# Patient Record
Sex: Female | Born: 1995 | Race: White | Hispanic: No | Marital: Married | Smoking: Former smoker
Health system: Southern US, Community
[De-identification: ages and names within clinical notes are randomized; demographics above are authoritative.]

## PROBLEM LIST (undated history)

## (undated) DIAGNOSIS — K529 Noninfective gastroenteritis and colitis, unspecified: Secondary | ICD-10-CM

## (undated) DIAGNOSIS — N3010 Interstitial cystitis (chronic) without hematuria: Secondary | ICD-10-CM

## (undated) DIAGNOSIS — F419 Anxiety disorder, unspecified: Secondary | ICD-10-CM

## (undated) DIAGNOSIS — G44099 Other trigeminal autonomic cephalgias (TAC), not intractable: Secondary | ICD-10-CM

## (undated) DIAGNOSIS — J45909 Unspecified asthma, uncomplicated: Secondary | ICD-10-CM

## (undated) DIAGNOSIS — F902 Attention-deficit hyperactivity disorder, combined type: Secondary | ICD-10-CM

## (undated) HISTORY — DX: Other trigeminal autonomic cephalgias (tac), not intractable: G44.099

## (undated) HISTORY — DX: Unspecified asthma, uncomplicated: J45.909

## (undated) HISTORY — DX: Interstitial cystitis (chronic) without hematuria: N30.10

## (undated) HISTORY — DX: Attention-deficit hyperactivity disorder, combined type: F90.2

## (undated) HISTORY — DX: Anxiety disorder, unspecified: F41.9

## (undated) HISTORY — DX: Noninfective gastroenteritis and colitis, unspecified: K52.9

---
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
## 2018-09-29 DIAGNOSIS — F331 Major depressive disorder, recurrent, moderate: Secondary | ICD-10-CM | POA: Insufficient documentation

## 2018-09-29 HISTORY — DX: Major depressive disorder, recurrent, moderate: F33.1

## 2019-07-20 DIAGNOSIS — IMO0002 Reserved for concepts with insufficient information to code with codable children: Secondary | ICD-10-CM | POA: Insufficient documentation

## 2019-07-20 DIAGNOSIS — G43009 Migraine without aura, not intractable, without status migrainosus: Secondary | ICD-10-CM

## 2019-07-20 HISTORY — DX: Reserved for concepts with insufficient information to code with codable children: IMO0002

## 2019-07-20 HISTORY — DX: Migraine without aura, not intractable, without status migrainosus: G43.009

## 2019-08-16 ENCOUNTER — Encounter: Payer: Self-pay | Admitting: *Deleted

## 2019-08-16 ENCOUNTER — Ambulatory Visit (INDEPENDENT_AMBULATORY_CARE_PROVIDER_SITE_OTHER): Payer: BC Managed Care – PPO

## 2019-08-16 ENCOUNTER — Encounter: Payer: Self-pay | Admitting: Cardiology

## 2019-08-16 ENCOUNTER — Ambulatory Visit (INDEPENDENT_AMBULATORY_CARE_PROVIDER_SITE_OTHER): Payer: BC Managed Care – PPO | Admitting: Cardiology

## 2019-08-16 ENCOUNTER — Other Ambulatory Visit: Payer: Self-pay

## 2019-08-16 VITALS — BP 120/60 | HR 80 | Ht 71.0 in | Wt 161.0 lb

## 2019-08-16 DIAGNOSIS — R55 Syncope and collapse: Secondary | ICD-10-CM

## 2019-08-16 DIAGNOSIS — R0789 Other chest pain: Secondary | ICD-10-CM

## 2019-08-16 DIAGNOSIS — R002 Palpitations: Secondary | ICD-10-CM

## 2019-08-16 HISTORY — DX: Syncope and collapse: R55

## 2019-08-16 HISTORY — DX: Other chest pain: R07.89

## 2019-08-16 HISTORY — DX: Palpitations: R00.2

## 2019-08-16 NOTE — Patient Instructions (Signed)
Medication Instructions:  Your physician recommends that you continue on your current medications as directed. Please refer to the Current Medication list given to you today.  *If you need a refill on your cardiac medications before your next appointment, please call your pharmacy*   Lab Work: None If you have labs (blood work) drawn today and your tests are completely normal, you will receive your results only by: . MyChart Message (if you have MyChart) OR . A paper copy in the mail If you have any lab test that is abnormal or we need to change your treatment, we will call you to review the results.   Testing/Procedures: Your physician has requested that you have an echocardiogram. Echocardiography is a painless test that uses sound waves to create images of your heart. It provides your doctor with information about the size and shape of your heart and how well your heart's chambers and valves are working. This procedure takes approximately one hour. There are no restrictions for this procedure.  A zio monitor was ordered today. It will remain on for 14 days. You will then return monitor and event diary in provided box. It takes 1-2 weeks for report to be downloaded and returned to us. We will call you with the results. If monitor falls off or has orange flashing light, please call Zio for further instructions.      Follow-Up: At [facility], you and your health needs are our priority.  As part of our continuing mission to provide you with exceptional heart care, we have created designated Provider Care Teams.  These Care Teams include your primary Cardiologist (physician) and Advanced Practice Providers (APPs -  Physician Assistants and Nurse Practitioners) who all work together to provide you with the care you need, when you need it.  We recommend signing up for the patient portal called "MyChart".  Sign up information is provided on this After Visit Summary.  MyChart is used to  connect with patients for Virtual Visits (Telemedicine).  Patients are able to view lab/test results, encounter notes, upcoming appointments, etc.  Non-urgent messages can be sent to your provider as well.   To learn more about what you can do with MyChart, go to [email or web address].    Your next appointment:   3 month(s)  The format for your next appointment:   In Person  Provider:   Kardie Tobb, DO   Other Instructions   

## 2019-08-16 NOTE — Progress Notes (Signed)
Cardiology Office Note:    Date:  08/16/2019   ID:  Cheryl Andrews, DOB 05/11/1995, MRN [id number]  PCP:  Adrienne Mocha, PA  Cardiologist:  Thomasene Ripple, DO  Electrophysiologist:  None   Referring MD: Adrienne Mocha, PA   Chief Complaint  Patient presents with  . Tachycardia  . Loss of Consciousness    History of Present Illness:    Cheryl Andrews is a 24 y.o. female with a hx of anxiety disorder, ADHD, migraine headaches and asthma presents today to be evaluated for palpitations as well as syncope.  Patient tells me that over the last several months she has had increasing palpitations.  She notes that sometimes with her palpitation she feels dizzy and nauseous.  At first she thought that this was associated with her migraine headaches but recently noticed that is actually not all the time associated with migraine headaches.  In addition she is really concerned as she has had some syncope episodes.  She notes that prior to her passing out she experienced some blurry vision slowly goes and she passes out.  She is not sure how long she is down for when she passed out.  But she usually wakes up and denies any bowel or bladder incontinence.  I was able to speak with her partner who also says she does not have any seizure activities during this time.  The patient tells me that one of her episode she felt as if she was having diffuse shooting chest pain going across her chest.  No other complaints at this time.  Past Medical History:  Diagnosis Date  . ADHD (attention deficit hyperactivity disorder), combined type   . Anxiety   . Asthma   . Chronic migraine 07/20/2019  . Interstitial cystitis   . MDD (major depressive disorder), recurrent episode, moderate (HCC) 09/29/2018  . Migraine without aura and without status migrainosus, not intractable 07/20/2019  . Trigeminal autonomic cephalgias     Past Surgical History:  Procedure Laterality Date  . TONSILLECTOMY  2002    Current  Medications: Current Meds  Medication Sig  . albuterol (VENTOLIN HFA) 108 (90 Base) MCG/ACT inhaler Inhale 1 puff into the lungs every 6 (six) hours as needed for wheezing or shortness of breath.  . amphetamine-dextroamphetamine (ADDERALL) 5 MG tablet Take 5 mg by mouth daily.  . chlorzoxazone (PARAFON) 500 MG tablet Take 500 mg by mouth 3 (three) times daily as needed for muscle spasms.  . DULoxetine (CYMBALTA) 20 MG capsule Take 20 mg by mouth 2 (two) times daily.  Marland Kitchen levonorgestrel (KYLEENA) 19.5 MG IUD by Intrauterine route once.  . Meth-Hyo-M Bl-Na Phos-Ph Sal (URIBEL) 118 MG CAPS Take 1 capsule by mouth in the morning, at noon, in the evening, and at bedtime.   . naproxen sodium (ALEVE) 220 MG tablet Take 220 mg by mouth as needed.  . ondansetron (ZOFRAN) 4 MG tablet Take 4 mg by mouth daily.  . Rimegepant Sulfate (NURTEC) 75 MG TBDP Take 1 tablet by mouth every other day.     Allergies:   Paba derivatives and Protonix [pantoprazole]   Social History   Socioeconomic History  . Marital status: Married    Spouse name: Not on file  . Number of children: Not on file  . Years of education: Not on file  . Highest education level: Not on file  Occupational History  . Not on file  Tobacco Use  . Smoking status: Former Smoker    Types: Cigarettes  Quit date: 2016    Years since quitting: 5.6  . Smokeless tobacco: Never Used  Substance and Sexual Activity  . Alcohol use: Never  . Drug use: Never  . Sexual activity: Not on file  Other Topics Concern  . Not on file  Social History Narrative  . Not on file   Social Determinants of Health   Financial Resource Strain:   . Difficulty of Paying Living Expenses:   Food Insecurity:   . Worried About Programme researcher, broadcasting/film/video in the Last Year:   . Barista in the Last Year:   Transportation Needs:   . Freight forwarder (Medical):   Marland Kitchen Lack of Transportation (Non-Medical):   Physical Activity:   . Days of Exercise per Week:    . Minutes of Exercise per Session:   Stress:   . Feeling of Stress :   Social Connections:   . Frequency of Communication with Friends and Family:   . Frequency of Social Gatherings with Friends and Family:   . Attends Religious Services:   . Active Member of Clubs or Organizations:   . Attends Banker Meetings:   Marland Kitchen Marital Status:      Family History: The patient's family history includes Arrhythmia in her mother; Diabetes in her maternal grandfather and paternal grandfather; Hypertension in her father, maternal grandfather, mother, and paternal grandfather; Thyroid disease in her mother; Uterine cancer in her paternal grandmother.  ROS:   Review of Systems  Constitution: Reports dizziness.  Negative for decreased appetite, fever and weight gain.  HENT: Negative for congestion, ear discharge, hoarse voice and sore throat.   Eyes: Negative for discharge, redness, vision loss in right eye and visual halos.  Cardiovascular: Reports sharp chest pain and palpitations.  Negative for dyspnea on exertion, leg swelling. Respiratory: Negative for cough, hemoptysis, shortness of breath and snoring.   Endocrine: Negative for heat intolerance and polyphagia.  Hematologic/Lymphatic: Negative for bleeding problem. Does not bruise/bleed easily.  Skin: Negative for flushing, nail changes, rash and suspicious lesions.  Musculoskeletal: Negative for arthritis, joint pain, muscle cramps, myalgias, neck pain and stiffness.  Gastrointestinal: Negative for abdominal pain, bowel incontinence, diarrhea and excessive appetite.  Genitourinary: Negative for decreased libido, genital sores and incomplete emptying.  Neurological: Negative for brief paralysis, focal weakness, headaches and loss of balance.  Psychiatric/Behavioral: Negative for altered mental status, depression and suicidal ideas.  Allergic/Immunologic: Negative for HIV exposure and persistent infections.    EKGs/Labs/Other Studies  Reviewed:    The following studies were reviewed today:   EKG:  The ekg ordered today demonstrates sinus rhythm, heart rate 78 bpm with P wave morphology she has a left atrial argument.  RSR prime.  No prior EKG for comparison.  Recent Labs: No results found for requested labs within last 8760 hours.  Recent Lipid Panel No results found for: CHOL, TRIG, HDL, CHOLHDL, VLDL, LDLCALC, LDLDIRECT  Physical Exam:    VS:  BP 120/60 (BP Location: Left Arm, Patient Position: Sitting, Cuff Size: Normal)   Pulse 80   Ht 5\' 11"  (1.803 m)   Wt 161 lb (73 kg)   SpO2 99%   BMI 22.45 kg/m     Wt Readings from Last 3 Encounters:  08/16/19 161 lb (73 kg)     GEN: Well nourished, well developed in no acute distress HEENT: Normal NECK: No JVD; No carotid bruits LYMPHATICS: No lymphadenopathy CARDIAC: S1S2 noted,RRR, no murmurs, rubs, gallops RESPIRATORY:  Clear to auscultation  without rales, wheezing or rhonchi  ABDOMEN: Soft, non-tender, non-distended, +bowel sounds, no guarding. EXTREMITIES: No edema, No cyanosis, no clubbing MUSCULOSKELETAL:  No deformity  SKIN: Warm and dry NEUROLOGIC:  Alert and oriented x 3, non-focal PSYCHIATRIC:  Normal affect, good insight  ASSESSMENT:    1. Palpitations   2. Syncope and collapse   3. Atypical chest pain    PLAN:    1.  I would like to rule out a cardiovascular etiology of this palpitation and syncope, therefore at this time I would like to placed a zio patch for 14 days. In additon a transthoracic echocardiogram will be ordered to assess LV/RV function and any structural abnormalities. Once these testing have been performed amd reviewed further reccomendations will be made. For now, I do reccomend that the patient goes to the nearest ED if  symptoms recur.  2.  Her chest pain is atypical therefore we will get an echocardiogram above in reassess need for any further testing.  3.  I did discuss the [place] DMV medical guidelines for driving: "it is  prudent to recommend that all persons should be free of syncopal episodes for at least six months to be granted the driving privilege." (THE [place] PHYSICIAN'S GUIDE TO DRIVER MEDICAL EVALUATION, Second Edition, Medical Review Branch, Associate ProfessorDriver License Section, Division of [employer], [employer] [place] Department of Transportation, July 2004).  I reviewed her lab which was done at her PCP office TSH is 1.11 no need to repeat any of her lab studies.  The patient is in agreement with the above plan. The patient left the office in stable condition.  The patient will follow up in 3 months or sooner if needed.   Medication Adjustments/Labs and Tests Ordered: Current medicines are reviewed at length with the patient today.  Concerns regarding medicines are outlined above.  Orders Placed This Encounter  Procedures  . LONG TERM MONITOR (3-14 DAYS)  . EKG 12-Lead  . ECHOCARDIOGRAM COMPLETE   No orders of the defined types were placed in this encounter.   Patient Instructions  Medication Instructions:  Your physician recommends that you continue on your current medications as directed. Please refer to the Current Medication list given to you today.  *If you need a refill on your cardiac medications before your next appointment, please call your pharmacy*   Lab Work: None If you have labs (blood work) drawn today and your tests are completely normal, you will receive your results only by: Marland Kitchen. MyChart Message (if you have MyChart) OR . A paper copy in the mail If you have any lab test that is abnormal or we need to change your treatment, we will call you to review the results.   Testing/Procedures: Your physician has requested that you have an echocardiogram. Echocardiography is a painless test that uses sound waves to create images of your heart. It provides your doctor with information about the size and shape of your heart and how well your heart's chambers and valves are working. This  procedure takes approximately one hour. There are no restrictions for this procedure.  A zio monitor was ordered today. It will remain on for 14 days. You will then return monitor and event diary in provided box. It takes 1-2 weeks for report to be downloaded and returned to us. We will call you with the results. If monitor falls off or has orange flashing light, please call Zio for further instructions.      Follow-Up: [facility], you and  your health needs are our priority.  As part of our continuing mission to provide you with exceptional heart care, we have created designated Provider Care Teams.  These Care Teams include your primary Cardiologist (physician) and Advanced Practice Providers (APPs -  Physician Assistants and Nurse Practitioners) who all work together to provide you with the care you need, when you need it.  We recommend signing up for the patient portal called "MyChart".  Sign up information is provided on this After Visit Summary.  MyChart is used to connect with patients for Virtual Visits (Telemedicine).  Patients are able to view lab/test results, encounter notes, upcoming appointments, etc.  Non-urgent messages can be sent to your provider as well.   To learn more about what you can do with MyChart, go to [email or web address].    Your next appointment:   3 month(s)  The format for your next appointment:   In Person  Provider:   Thomasene Ripple, DO   Other Instructions      Adopting a Healthy Lifestyle.  Know what a healthy weight is for you (roughly BMI <25) and aim to maintain this   Aim for 7+ servings of fruits and vegetables daily   65-80+ fluid ounces of water or unsweet tea for healthy kidneys   Limit to max 1 drink of alcohol per day; avoid smoking/tobacco   Limit animal fats in diet for cholesterol and heart health - choose grass fed whenever available   Avoid highly processed foods, and foods high in saturated/trans fats   Aim for low  stress - take time to unwind and care for your mental health   Aim for 150 min of moderate intensity exercise weekly for heart health, and weights twice weekly for bone health   Aim for 7-9 hours of sleep daily   When it comes to diets, agreement about the perfect plan isnt easy to find, even among the experts. Experts at the [facility] of [employer] developed an idea known as the Healthy Eating Plate. Just imagine a plate divided into logical, healthy portions.   The emphasis is on diet quality:   Load up on vegetables and fruits - one-half of your plate: Aim for color and variety, and remember that potatoes dont count.   Go for whole grains - one-quarter of your plate: Whole wheat, barley, wheat berries, quinoa, oats, brown rice, and foods made with them. If you want pasta, go with whole wheat pasta.   Protein power - one-quarter of your plate: Fish, chicken, beans, and nuts are all healthy, versatile protein sources. Limit red meat.   The diet, however, does go beyond the plate, offering a few other suggestions.   Use healthy plant oils, such as olive, canola, soy, corn, sunflower and peanut. Check the labels, and avoid partially hydrogenated oil, which have unhealthy trans fats.   If youre thirsty, drink water. Coffee and tea are good in moderation, but skip sugary drinks and limit milk and dairy products to one or two daily servings.   The type of carbohydrate in the diet is more important than the amount. Some sources of carbohydrates, such as vegetables, fruits, whole grains, and beans-are healthier than others.   Finally, stay active  Signed, Thomasene Ripple, DO  08/16/2019 4:21 PM    [facility]

## 2019-08-18 ENCOUNTER — Other Ambulatory Visit: Payer: Self-pay

## 2019-08-18 ENCOUNTER — Ambulatory Visit (INDEPENDENT_AMBULATORY_CARE_PROVIDER_SITE_OTHER): Payer: BC Managed Care – PPO

## 2019-08-18 DIAGNOSIS — R55 Syncope and collapse: Secondary | ICD-10-CM | POA: Diagnosis not present

## 2019-08-18 DIAGNOSIS — R002 Palpitations: Secondary | ICD-10-CM | POA: Diagnosis not present

## 2019-08-18 LAB — ECHOCARDIOGRAM COMPLETE
Area-P 1/2: 3.6 cm2
S' Lateral: 2.8 cm

## 2019-08-18 NOTE — Progress Notes (Signed)
Complete echocardiogram has been performed.  Jimmy Adarius Tigges RDCS, RVT 

## 2019-08-19 ENCOUNTER — Telehealth: Payer: Self-pay

## 2019-08-19 ENCOUNTER — Telehealth: Payer: Self-pay | Admitting: Cardiology

## 2019-08-19 ENCOUNTER — Encounter: Payer: Self-pay | Admitting: Neurology

## 2019-08-19 NOTE — Telephone Encounter (Signed)
Left message on patients voicemail to please return our call.   

## 2019-08-19 NOTE — Telephone Encounter (Signed)
-----   Message from Thomasene Ripple, DO sent at 08/18/2019  6:18 PM EDT ----- Echo normal

## 2019-08-19 NOTE — Telephone Encounter (Signed)
Spoke with patient regarding results and recommendation.  Patient verbalizes understanding and is agreeable to plan of care. Advised patient to call back with any issues or concerns.  

## 2019-08-19 NOTE — Telephone Encounter (Signed)
New message ° ° ° ° ° °Returning a call to the nurse to get echo results °

## 2019-08-27 ENCOUNTER — Emergency Department (HOSPITAL_COMMUNITY)
Admission: EM | Admit: 2019-08-27 | Discharge: 2019-08-28 | Disposition: A | Discharge status: Home / Self Care | Payer: BC Managed Care – PPO | Attending: Emergency Medicine | Admitting: Emergency Medicine

## 2019-08-27 ENCOUNTER — Encounter (HOSPITAL_COMMUNITY): Payer: Self-pay | Admitting: Emergency Medicine

## 2019-08-27 ENCOUNTER — Other Ambulatory Visit: Payer: Self-pay

## 2019-08-27 DIAGNOSIS — R112 Nausea with vomiting, unspecified: Secondary | ICD-10-CM | POA: Insufficient documentation

## 2019-08-27 DIAGNOSIS — R519 Headache, unspecified: Secondary | ICD-10-CM | POA: Diagnosis present

## 2019-08-27 DIAGNOSIS — Z5321 Procedure and treatment not carried out due to patient leaving prior to being seen by health care provider: Secondary | ICD-10-CM | POA: Insufficient documentation

## 2019-08-27 LAB — CBC
HCT: 42 % (ref 36.0–46.0)
Hemoglobin: 14.6 g/dL (ref 12.0–15.0)
MCH: 32.6 pg (ref 26.0–34.0)
MCHC: 34.8 g/dL (ref 30.0–36.0)
MCV: 93.8 fL (ref 80.0–100.0)
Platelets: 212 10*3/uL (ref 150–400)
RBC: 4.48 MIL/uL (ref 3.87–5.11)
RDW: 11.9 % (ref 11.5–15.5)
WBC: 8.1 10*3/uL (ref 4.0–10.5)
nRBC: 0 % (ref 0.0–0.2)

## 2019-08-27 LAB — COMPREHENSIVE METABOLIC PANEL
ALT: 19 U/L (ref 0–44)
AST: 19 U/L (ref 15–41)
Albumin: 5.2 g/dL — ABNORMAL HIGH (ref 3.5–5.0)
Alkaline Phosphatase: 54 U/L (ref 38–126)
Anion gap: 10 (ref 5–15)
BUN: 10 mg/dL (ref 6–20)
CO2: 24 mmol/L (ref 22–32)
Calcium: 9.3 mg/dL (ref 8.9–10.3)
Chloride: 106 mmol/L (ref 98–111)
Creatinine, Ser: 0.8 mg/dL (ref 0.44–1.00)
GFR calc Af Amer: >60 mL/min (ref >60)
GFR calc non Af Amer: >60 mL/min (ref >60)
Glucose, Bld: 118 mg/dL — ABNORMAL HIGH (ref 70–99)
Potassium: 4.2 mmol/L (ref 3.5–5.1)
Sodium: 140 mmol/L (ref 135–145)
Total Bilirubin: 0.6 mg/dL (ref 0.3–1.2)
Total Protein: 7.7 g/dL (ref 6.5–8.1)

## 2019-08-27 LAB — LIPASE, BLOOD: Lipase: 29 U/L (ref 11–51)

## 2019-08-27 LAB — I-STAT BETA HCG BLOOD, ED (MC, WL, AP ONLY): I-stat hCG, quantitative: <5 m[IU]/mL (ref <5)

## 2019-08-27 NOTE — ED Triage Notes (Signed)
Patient c/o headache x6 months, worsening today with light sensitivity. Reports N/V today. Hx migraines.

## 2019-08-30 ENCOUNTER — Encounter: Payer: Self-pay | Admitting: *Deleted

## 2019-08-30 ENCOUNTER — Other Ambulatory Visit: Payer: Self-pay | Admitting: *Deleted

## 2019-08-31 ENCOUNTER — Encounter: Payer: Self-pay | Admitting: Diagnostic Neuroimaging

## 2019-08-31 ENCOUNTER — Telehealth: Payer: Self-pay | Admitting: *Deleted

## 2019-08-31 ENCOUNTER — Other Ambulatory Visit: Payer: Self-pay

## 2019-08-31 ENCOUNTER — Ambulatory Visit (INDEPENDENT_AMBULATORY_CARE_PROVIDER_SITE_OTHER): Payer: BC Managed Care – PPO | Admitting: Diagnostic Neuroimaging

## 2019-08-31 VITALS — BP 127/73 | HR 83 | Ht 71.0 in | Wt 160.0 lb

## 2019-08-31 DIAGNOSIS — R112 Nausea with vomiting, unspecified: Secondary | ICD-10-CM | POA: Diagnosis not present

## 2019-08-31 DIAGNOSIS — G4452 New daily persistent headache (NDPH): Secondary | ICD-10-CM

## 2019-08-31 DIAGNOSIS — G43711 Chronic migraine without aura, intractable, with status migrainosus: Secondary | ICD-10-CM

## 2019-08-31 MED ORDER — UBRELVY 50 MG PO TABS: 50.0000 mg | ORAL_TABLET | ORAL | 6 refills | Status: DC | PRN

## 2019-08-31 MED ORDER — EMGALITY 120 MG/ML ~~LOC~~ SOAJ: SUBCUTANEOUS | 4 refills | Status: DC

## 2019-08-31 MED ORDER — PROMETHAZINE HCL 12.5 MG PO TABS: 12.5000 mg | ORAL_TABLET | Freq: Four times a day (QID) | ORAL | 1 refills | Status: DC | PRN

## 2019-08-31 NOTE — Telephone Encounter (Signed)
[facility] PA, key: [id number], G43.711, G44.52, patient not found. Linda Hedges, spoke with pharmacist. Insurance ID# [id number], BIN [phone], PCN: [id number], Grp: Rxbenefit.  CMM still not finding patient. Called patient, LVM requesting call back with insurance information.

## 2019-08-31 NOTE — Patient Instructions (Signed)
WORSENING HEADACHES (nausea, syncope) - check MRI brain   MIGRAINE PREVENTION  LIFESTYLE CHANGES -Stop or avoid smoking -Decrease or avoid caffeine / alcohol -Eat and sleep on a regular schedule -Exercise several times per week - start galazanezumab (Emgality) 240mg  loading dose; then 120mg  monthly  MIGRAINE RESCUE  - ibuprofen, tylenol as needed - ubrogepant ) 100mg  as needed for breakthrough headache; may repeat x 1 after 2 hours; max 2 tabs per day or 8 per month - phenergan as needed

## 2019-08-31 NOTE — Progress Notes (Signed)
[facility]  PATIENT: Cheryl Andrews DOB: [birth year]  REFERRING CLINICIAN: Adrienne Mocha, PA HISTORY FROM: patient  REASON FOR VISIT: new consult    HISTORICAL  CHIEF COMPLAINT:  Chief Complaint  Patient presents with  . trigeminal autonomic cephalalgias    rm 7 New Pt "second opinion; have tried/failed multiple medications"     HISTORY OF PRESENT ILLNESS:   24 year old female here for evaluation of headaches.  In 2014 patient had a head injury while playing soccer and hit metal goalpost after being kicked.  She had headaches and postconcussion syndrome.  In 2016 patient continued to have headaches, was treated with occipital nerve blocks and Phenergan.  She had some intermittent passing out sensations with headaches.  Sometimes she would feel headache, ringing in the ears, and passed out.  Triggers would include heat, sunlight or hot shower.  Nowadays patient having constant burning and aching sensation on her head, right greater than left side but global sensation as well.  Sometimes she has severe throbbing headaches with nausea and vomiting, right eye pain, sensitive to light and sound, sensitivity of smell.  Sometimes she sees an aura before the headaches with a large purple splotch obscuring her vision.  Patient has daily headaches as well as 4-5 severe headaches per month.  Patient is tried variety medications including verapamil, Topamax, amitriptyline, Imitrex, Fioricet, Cymbalta, prednisone, ibuprofen, oxymorphone, Phenergan, Zofran, Aimovig, Cefaly, Nurtec, Maxalt, Midrin, Relpax, tramadol, nerve blocks, Flexeril, chlorzoxazone.  Patient also has anxiety, insomnia, ADHD, interstitial cystitis.     REVIEW OF SYSTEMS: Full 14 system review of systems performed and negative with exception of: As per HPI.  ALLERGIES: Allergies  Allergen Reactions  . Paba Derivatives Hives  . Protonix [Pantoprazole] Nausea And Vomiting    HOME  MEDICATIONS: Outpatient Medications Prior to Visit  Medication Sig Dispense Refill  . albuterol (VENTOLIN HFA) 108 (90 Base) MCG/ACT inhaler Inhale 1 puff into the lungs every 6 (six) hours as needed for wheezing or shortness of breath.    . amphetamine-dextroamphetamine (ADDERALL) 5 MG tablet Take 5 mg by mouth daily.    . chlorzoxazone (PARAFON) 500 MG tablet Take 500 mg by mouth 3 (three) times daily as needed for muscle spasms.    . DULoxetine (CYMBALTA) 20 MG capsule Take 20 mg by mouth 2 (two) times daily.    Marland Kitchen levonorgestrel (KYLEENA) 19.5 MG IUD by Intrauterine route once.    . Meth-Hyo-M Bl-Na Phos-Ph Sal (URIBEL) 118 MG CAPS Take 1 capsule by mouth in the morning, at noon, in the evening, and at bedtime.     . naproxen sodium (ALEVE) 220 MG tablet Take 220 mg by mouth as needed.    . ondansetron (ZOFRAN) 4 MG tablet Take 4 mg by mouth daily.    . Rimegepant Sulfate (NURTEC) 75 MG TBDP Take 1 tablet by mouth every other day. (Patient not taking: Reported on 08/31/2019)     No facility-administered medications prior to visit.    PAST MEDICAL HISTORY: Past Medical History:  Diagnosis Date  . ADHD (attention deficit hyperactivity disorder), combined type   . Anxiety   . Asthma   . Chronic diarrhea   . Chronic migraine 07/20/2019  . Interstitial cystitis   . MDD (major depressive disorder), recurrent episode, moderate (HCC) 09/29/2018  . Migraine without aura and without status migrainosus, not intractable 07/20/2019  . Trigeminal autonomic cephalgias     PAST SURGICAL HISTORY: Past Surgical History:  Procedure Laterality Date  . TONSILLECTOMY  2002  . WISDOM TOOTH EXTRACTION  2016    FAMILY HISTORY: Family History  Problem Relation Age of Onset  . Arrhythmia Mother   . Thyroid disease Mother   . Hypertension Mother   . Hypertension Father   . Diabetes Maternal Grandfather   . Hypertension Maternal Grandfather   . Uterine cancer Paternal Grandmother   . Diabetes  Paternal Grandfather   . Hypertension Paternal Grandfather     SOCIAL HISTORY: Social History   Socioeconomic History  . Marital status: Married    Spouse name: Not on file  . Number of children: 0  . Years of education: Not on file  . Highest education level: High school graduate  Occupational History  . Not on file  Tobacco Use  . Smoking status: Former Smoker    Types: Cigarettes    Quit date: 01/07/2017    Years since quitting: 2.6  . Smokeless tobacco: Never Used  Substance and Sexual Activity  . Alcohol use: Never  . Drug use: Never  . Sexual activity: Not on file  Other Topics Concern  . Not on file  Social History Narrative   Lives with husband   Caffeine- coffee 1 c daily   Social Determinants of Health   Financial Resource Strain:   . Difficulty of Paying Living Expenses: Not on file  Food Insecurity:   . Worried About Programme researcher, broadcasting/film/video in the Last Year: Not on file  . Ran Out of Food in the Last Year: Not on file  Transportation Needs:   . Lack of Transportation (Medical): Not on file  . Lack of Transportation (Non-Medical): Not on file  Physical Activity:   . Days of Exercise per Week: Not on file  . Minutes of Exercise per Session: Not on file  Stress:   . Feeling of Stress : Not on file  Social Connections:   . Frequency of Communication with Friends and Family: Not on file  . Frequency of Social Gatherings with Friends and Family: Not on file  . Attends Religious Services: Not on file  . Active Member of Clubs or Organizations: Not on file  . Attends Banker Meetings: Not on file  . Marital Status: Not on file  Intimate Partner Violence:   . Fear of Current or Ex-Partner: Not on file  . Emotionally Abused: Not on file  . Physically Abused: Not on file  . Sexually Abused: Not on file     PHYSICAL EXAM  GENERAL EXAM/CONSTITUTIONAL: Vitals:  Vitals:   08/31/19 1106  BP: 127/73  Pulse: 83  Weight: 160 lb (72.6 kg)  Height:  5\' 11"  (1.803 m)     Body mass index is 22.32 kg/m. Wt Readings from Last 3 Encounters:  08/31/19 160 lb (72.6 kg)  08/16/19 161 lb (73 kg)     Patient is in no distress; well developed, nourished and groomed; neck is supple  CARDIOVASCULAR:  Examination of carotid arteries is normal; no carotid bruits  Regular rate and rhythm, no murmurs  Examination of peripheral vascular system by observation and palpation is normal  EYES:  Ophthalmoscopic exam of optic discs and posterior segments is normal; no papilledema or hemorrhages  No exam data present  MUSCULOSKELETAL:  Gait, strength, tone, movements noted in Neurologic exam below  NEUROLOGIC: MENTAL STATUS:  No flowsheet data found.  awake, alert, oriented to person, place and time  recent and remote memory intact  normal attention and concentration  language fluent, comprehension intact,  naming intact  fund of knowledge appropriate  CRANIAL NERVE:   2nd - no papilledema on fundoscopic exam  2nd, 3rd, 4th, 6th - pupils equal and reactive to light, visual fields full to confrontation, extraocular muscles intact, no nystagmus  5th - facial sensation symmetric  7th - facial strength symmetric  8th - hearing intact  9th - palate elevates symmetrically, uvula midline  11th - shoulder shrug symmetric  12th - tongue protrusion midline  MOTOR:   normal bulk and tone, full strength in the BUE, BLE  SENSORY:   normal and symmetric to light touch, temperature, vibration  COORDINATION:   finger-nose-finger, fine finger movements normal  REFLEXES:   deep tendon reflexes present and symmetric  GAIT/STATION:   narrow based gait; able to walk on toes, heels and tandem; romberg is negative     DIAGNOSTIC DATA (LABS, IMAGING, TESTING) - I reviewed patient records, labs, notes, testing and imaging myself where available.  Lab Results  Component Value Date   WBC 8.1 08/27/2019   HGB 14.6  08/27/2019   HCT 42.0 08/27/2019   MCV 93.8 08/27/2019   PLT 212 08/27/2019      Component Value Date/Time   NA 140 08/27/2019 2226   K 4.2 08/27/2019 2226   CL 106 08/27/2019 2226   CO2 24 08/27/2019 2226   GLUCOSE 118 (H) 08/27/2019 2226   BUN 10 08/27/2019 2226   CREATININE 0.80 08/27/2019 2226   CALCIUM 9.3 08/27/2019 2226   PROT 7.7 08/27/2019 2226   ALBUMIN 5.2 (H) 08/27/2019 2226   AST 19 08/27/2019 2226   ALT 19 08/27/2019 2226   ALKPHOS 54 08/27/2019 2226   BILITOT 0.6 08/27/2019 2226   GFRNONAA >60 08/27/2019 2226   GFRAA >60 08/27/2019 2226   No results found for: CHOL, HDL, LDLCALC, LDLDIRECT, TRIG, CHOLHDL No results found for: RKYH0W No results found for: VITAMINB12 No results found for: TSH     ASSESSMENT AND PLAN  24 y.o. year old female here with intractable chronic daily headaches and intractable chronic migraine with aura and without aura.  Patient has tried variety of medications without relief.  Headaches are worsening and patient having intermittent syncopal events.  Will check MRI of the brain to rule out any other secondary cause.  Meds tried and failed: verapamil, Topamax, amitriptyline, Imitrex, Fioricet, Cymbalta, prednisone, ibuprofen, oxymorphone, Phenergan, Zofran, Aimovig, Cefaly, Nurtec, Maxalt, Midrin, Relpax, tramadol, nerve blocks, Flexeril, chlorzoxazone.  Dx:  1. Intractable chronic migraine without aura and with status migrainosus   2. New daily persistent headache   3. Intractable nausea and vomiting     PLAN:  WORSENING HEADACHES (nausea, syncope) - check MRI brain   MIGRAINE PREVENTION  LIFESTYLE CHANGES -Stop or avoid smoking -Decrease or avoid caffeine / alcohol -Eat and sleep on a regular schedule -Exercise several times per week - start galazanezumab (Emgality) 240mg  loading dose; then 120mg  monthly - consider referral to headache specialist clinic [facility] or [place])  MIGRAINE RESCUE  - ibuprofen,  tylenol as needed - ubrogepant [facility]) 100mg  as needed for breakthrough headache; may repeat x 1 after 2 hours; max 2 tabs per day or 8 per month - phenergan as needed   Orders Placed This Encounter  Procedures  . MR BRAIN W WO CONTRAST   Meds ordered this encounter  Medications  . Galcanezumab-gnlm (EMGALITY) 120 MG/ML SOAJ    Sig: Start 240mg  injection loading dose x 1; after 1 month start 120mg  monthly injections    Dispense:  4  mL    Refill:  4  . Ubrogepant (UBRELVY) 50 MG TABS    Sig: Take 50 mg by mouth as needed. May repeat x 1 tab after 2 hours; max 2 tabs per day or 8 per month    Dispense:  8 tablet    Refill:  6  . promethazine (PHENERGAN) 12.5 MG tablet    Sig: Take 1 tablet (12.5 mg total) by mouth every 6 (six) hours as needed for nausea or vomiting.    Dispense:  30 tablet    Refill:  1   Return in about 6 months (around 03/02/2020) for with NP (Amy Lomax).    Suanne MarkerVIKRAM R. Matin Mattioli, MD 08/31/2019, 11:35 AM Certified in Neurology, Neurophysiology and Neuroimaging  [facility] [address], [address] [place], [place] [address] [phone]

## 2019-09-01 ENCOUNTER — Telehealth: Payer: Self-pay | Admitting: Diagnostic Neuroimaging

## 2019-09-01 NOTE — Telephone Encounter (Addendum)
Attempted PA with insurance info given, received same message, "not found with DOB, name".  Called patient and LVM advising her the info she provided will not work. Asked she call back.

## 2019-09-01 NOTE — Telephone Encounter (Signed)
i left a vmail for pt to call back her card is not scanned in and i need the provider ph # on the back of the card to proceed on with the MRI.

## 2019-09-01 NOTE — Telephone Encounter (Addendum)
Patient returned call, stated she is under her father's insurance. She has Rx card, gave me # on card, other information matches what pharmacy gave me. Called [phone], opt[employer] rx spoke with Fara Boros who stated this account is handled by Rx benefits, p [phone]. Called Rx benefits, per rep phone PA cannot be done.  Must use website [email or web address]. PA for Bernita Raisin and Emgailty done, faxed office notes to [phone].

## 2019-09-01 NOTE — Telephone Encounter (Signed)
Pt has called back to provide her insurance info:[employer]BS Contract# [id number] Group#[id number]

## 2019-09-02 NOTE — Telephone Encounter (Signed)
Emgality and [place] PAs still pending. I called patient and advised she may want to ask pharmacy to run Rx through this weekend. The approvals may go through before Monday. Patient verbalized understanding, appreciation.

## 2019-09-06 NOTE — Telephone Encounter (Addendum)
Called patient and discussed insurance denials. She stated she wasn't going to take [place], just [employer]. I advised she go online, get discount card and take to pharmacy. I asked about appts with [facility] neurology; she stated they are old appts she hasn't had chance to cancel yet.  I advised she call for any further questions. Patient verbalized understanding, appreciation.

## 2019-09-06 NOTE — Telephone Encounter (Addendum)
Per Rx Benefits web site [place] [employer]	[place] PA are still pending. She was in ED yesterday for  migraine and received NS, Reglan, magnesium, benadryl and Toradol. She has 2 upcoming appointments with [facility] neurology. Will call her today to advise of pending PA's , discuss her appointments with [facility].  Received fax from Rx Benefits: Emgality denied. Drug can only be approved if not being used in combination with another CGRP, and patient has > or = to 15 headache days per month of which 8 must be migraine days for at least 3 months. She reported daily headaches, 4-5 severe a month.  Called patient and LVM advising her of Emgality denial and I anticipate Nurtec will be denied as well.  I advised she can go online and get discount cards for both drugs. I informed her I noted her ED visit and upcoming appts with [facility] neurology, requested she call back to clarify if she is transferring care.

## 2019-09-06 NOTE — Telephone Encounter (Signed)
Pt returned call. Please call back when available. 

## 2019-09-07 NOTE — Telephone Encounter (Signed)
Received fax from Rx Benefits: Cheryl Andrews not approved. She must have < 15 headache days a month and medication not being used in combination with another oral  CGRP. AND is currently being treated with or has contraindication/intolerance to Elavil, effexor, depakote, topamax, atenolol, propranolol, nadolol, timolol, or metoprolol as documented in her medical record. additional coverage criteria may apply.

## 2019-09-08 ENCOUNTER — Telehealth: Payer: Self-pay | Admitting: *Deleted

## 2019-09-08 NOTE — Telephone Encounter (Signed)
Received call form patient who stated her insurance won't approve her MRI because her note said she only has 5 migraine days a month. She stated she has 5 migraine days with pain at a 10, but she has daily headaches. I advised her that it appears that [employer] has approved her MRI. She asked to speak to MRI coordinator. Irving Burton was not available, but will call patient back. I advised patient. Patient verbalized understanding, appreciation.

## 2019-09-09 NOTE — Telephone Encounter (Signed)
no to the covid questions MR Brain w/wo contrast Dr. Theresa Mulligan Auth: NPR Ref # [id number]. Patient is scheduled at [facility] for 09/14/19.

## 2019-09-09 NOTE — Telephone Encounter (Signed)
x2 lvm for pt to call back i need the phone number on the back of her insurance card

## 2019-09-09 NOTE — Telephone Encounter (Signed)
no to the covid questions MR Brain w/wo contrast Dr. Penumalli BCBS Auth: NPR Ref # [id number]. Patient is scheduled at [facility] for 09/14/19. °

## 2019-09-09 NOTE — Telephone Encounter (Signed)
x2 lvm for pt to call back i need the phone number on the back of her insurance card  

## 2019-09-14 ENCOUNTER — Other Ambulatory Visit: Payer: Self-pay

## 2019-09-14 ENCOUNTER — Ambulatory Visit: Payer: BC Managed Care – PPO

## 2019-09-14 DIAGNOSIS — G4452 New daily persistent headache (NDPH): Secondary | ICD-10-CM | POA: Diagnosis not present

## 2019-09-14 DIAGNOSIS — R112 Nausea with vomiting, unspecified: Secondary | ICD-10-CM | POA: Diagnosis not present

## 2019-09-14 DIAGNOSIS — G43711 Chronic migraine without aura, intractable, with status migrainosus: Secondary | ICD-10-CM

## 2019-09-14 MED ORDER — GADOBENATE DIMEGLUMINE 529 MG/ML IV SOLN
15.0000 mL | Freq: Once | INTRAVENOUS | Status: AC | PRN
  Administered 2019-09-14: 15 mL via INTRAVENOUS

## 2019-09-21 ENCOUNTER — Encounter: Payer: Self-pay | Admitting: *Deleted

## 2019-09-24 ENCOUNTER — Telehealth: Payer: Self-pay

## 2019-09-24 NOTE — Telephone Encounter (Signed)
Spoke with patient regarding results and recommendation.  Patient verbalizes understanding and is agreeable to plan of care. Advised patient to call back with any issues or concerns.  

## 2019-09-24 NOTE — Telephone Encounter (Signed)
Left message on patients voicemail to please return our call.   

## 2019-09-24 NOTE — Telephone Encounter (Signed)
-----   Message from Thomasene Ripple, DO sent at 09/23/2019  5:51 PM EDT ----- Luci Bank monitor was unremarkable - normal study.

## 2019-11-09 ENCOUNTER — Ambulatory Visit: Payer: BC Managed Care – PPO | Admitting: Neurology

## 2019-11-15 ENCOUNTER — Other Ambulatory Visit: Payer: Self-pay

## 2019-11-15 DIAGNOSIS — J45909 Unspecified asthma, uncomplicated: Secondary | ICD-10-CM | POA: Insufficient documentation

## 2019-11-15 DIAGNOSIS — K529 Noninfective gastroenteritis and colitis, unspecified: Secondary | ICD-10-CM | POA: Insufficient documentation

## 2019-11-15 DIAGNOSIS — N3010 Interstitial cystitis (chronic) without hematuria: Secondary | ICD-10-CM | POA: Insufficient documentation

## 2019-11-15 DIAGNOSIS — F419 Anxiety disorder, unspecified: Secondary | ICD-10-CM | POA: Insufficient documentation

## 2019-11-15 DIAGNOSIS — G44099 Other trigeminal autonomic cephalgias (TAC), not intractable: Secondary | ICD-10-CM | POA: Insufficient documentation

## 2019-11-17 ENCOUNTER — Other Ambulatory Visit: Payer: Self-pay

## 2019-11-17 ENCOUNTER — Ambulatory Visit (INDEPENDENT_AMBULATORY_CARE_PROVIDER_SITE_OTHER): Payer: BC Managed Care – PPO | Admitting: Cardiology

## 2019-11-17 ENCOUNTER — Encounter: Payer: Self-pay | Admitting: Cardiology

## 2019-11-17 VITALS — BP 122/70 | HR 80 | Ht 71.0 in | Wt 164.4 lb

## 2019-11-17 DIAGNOSIS — F419 Anxiety disorder, unspecified: Secondary | ICD-10-CM

## 2019-11-17 DIAGNOSIS — R002 Palpitations: Secondary | ICD-10-CM

## 2019-11-17 NOTE — Patient Instructions (Signed)
Medication Instructions:  No medication changes. *If you need a refill on your cardiac medications before your next appointment, please call your pharmacy*   Lab Work: None ordered If you have labs (blood work) drawn today and your tests are completely normal, you will receive your results only by: Marland Kitchen MyChart Message (if you have MyChart) OR . A paper copy in the mail If you have any lab test that is abnormal or we need to change your treatment, we will call you to review the results.   Testing/Procedures: None ordered   Follow-Up: At [facility], you and your health needs are our priority.  As part of our continuing mission to provide you with exceptional heart care, we have created designated Provider Care Teams.  These Care Teams include your primary Cardiologist (physician) and Advanced Practice Providers (APPs -  Physician Assistants and Nurse Practitioners) who all work together to provide you with the care you need, when you need it.  We recommend signing up for the patient portal called "MyChart".  Sign up information is provided on this After Visit Summary.  MyChart is used to connect with patients for Virtual Visits (Telemedicine).  Patients are able to view lab/test results, encounter notes, upcoming appointments, etc.  Non-urgent messages can be sent to your provider as well.   To learn more about what you can do with MyChart, go to [email or web address].    Your next appointment:   8 month(s)  The format for your next appointment:   In Person  Provider:   Thomasene Ripple, DO   Other Instructions NA

## 2019-11-17 NOTE — Progress Notes (Signed)
Cardiology Office Note:    Date:  11/17/2019   ID:  Cheryl Andrews, DOB 05-28-1995, MRN [id number]  PCP:  Cheryl Mocha, PA  Cardiologist:  Cheryl Ripple, DO  Electrophysiologist:  None   Referring MD: Cheryl Mocha, PA   " I am still having some ringing in my ears and then get dizzy"  History of Present Illness:    Cheryl Andrews is a 24 y.o. female with a hx of ADHD, anxiety initially presented due to palpitations syncope.  When I saw the patient during this presentation we discussed effects of her migraine headaches which will be causing her symptoms.  For completeness we will get an echocardiogram as well as a ZIO monitor.  The patient did wear it ZIO monitor as well as get her echocardiogram this testing were reported to be normal.  She tells me that she now is experiencing different symptoms when she get ringing in her ears and then feels significantly weak and have to sit down or else she may pass out.  She has been following with neurology in [place] and is not satisfied with her care there she has scheduled an appointment with Dr. Allena [facility] migraine clinic in [place].  Past Medical History:  Diagnosis Date  . ADHD (attention deficit hyperactivity disorder), combined type   . Anxiety   . Asthma   . Attention deficit hyperactivity disorder 12/26/2014  . Atypical chest pain 08/16/2019  . Chronic cluster headache 06/25/2013  . Chronic diarrhea   . Chronic migraine 07/20/2019  . Interstitial cystitis   . MDD (major depressive disorder), recurrent episode, moderate (HCC) 09/29/2018  . Migraine without aura and without status migrainosus, not intractable 07/20/2019  . Palpitations 08/16/2019  . Syncope and collapse 08/16/2019  . Trigeminal autonomic cephalgias     Past Surgical History:  Procedure Laterality Date  . TONSILLECTOMY  2002  . WISDOM TOOTH EXTRACTION  2016    Current Medications: Current Meds  Medication Sig  . albuterol (VENTOLIN HFA) 108 (90 Base)  MCG/ACT inhaler Inhale 1 puff into the lungs every 6 (six) hours as needed for wheezing or shortness of breath.  . amphetamine-dextroamphetamine (ADDERALL) 5 MG tablet Take 5 mg by mouth daily.  . chlorzoxazone (PARAFON) 500 MG tablet Take 500 mg by mouth 3 (three) times daily as needed for muscle spasms.  . DULoxetine (CYMBALTA) 20 MG capsule Take 20 mg by mouth 2 (two) times daily.  . Galcanezumab-gnlm (EMGALITY) 120 MG/ML SOAJ Start 240mg  injection loading dose x 1; after 1 month start 120mg  monthly injections  . levonorgestrel (KYLEENA) 19.5 MG IUD by Intrauterine route once.  . Meth-Hyo-M Bl-Na Phos-Ph Sal (URIBEL) 118 MG CAPS Take 1 capsule by mouth in the morning, at noon, in the evening, and at bedtime.   . methocarbamol (ROBAXIN) 750 MG tablet Take 750 mg by mouth at bedtime.  . naproxen sodium (ALEVE) 220 MG tablet Take 220 mg by mouth as needed.  . ondansetron (ZOFRAN) 4 MG tablet Take 4 mg by mouth daily.  . ondansetron (ZOFRAN-ODT) 4 MG disintegrating tablet Take 4 mg by mouth.  . promethazine (PHENERGAN) 12.5 MG tablet Take 1 tablet (12.5 mg total) by mouth every 6 (six) hours as needed for nausea or vomiting.  Ubrogepant (UBRELVY) 50 MG TABS Take 50 mg by mouth as needed. May repeat x 1 tab after 2 hours; max 2 tabs per day or 8 per month     Allergies:   Paba derivatives and Protonix [pantoprazole]  Social History   Socioeconomic History  . Marital status: Married    Spouse name: Not on file  . Number of children: 0  . Years of education: Not on file  . Highest education level: High school graduate  Occupational History  . Not on file  Tobacco Use  . Smoking status: Former Smoker    Types: Cigarettes    Quit date: 01/07/2017    Years since quitting: 2.8  . Smokeless tobacco: Never Used  Substance and Sexual Activity  . Alcohol use: Never  . Drug use: Never  . Sexual activity: Not on file  Other Topics Concern  . Not on file  Social History Narrative   Lives  with husband   Caffeine- coffee 1 c daily   Social Determinants of Health   Financial Resource Strain:   . Difficulty of Paying Living Expenses: Not on file  Food Insecurity:   . Worried About Programme researcher, broadcasting/film/video in the Last Year: Not on file  . Ran Out of Food in the Last Year: Not on file  Transportation Needs:   . Lack of Transportation (Medical): Not on file  . Lack of Transportation (Non-Medical): Not on file  Physical Activity:   . Days of Exercise per Week: Not on file  . Minutes of Exercise per Session: Not on file  Stress:   . Feeling of Stress : Not on file  Social Connections:   . Frequency of Communication with Friends and Family: Not on file  . Frequency of Social Gatherings with Friends and Family: Not on file  . Attends Religious Services: Not on file  . Active Member of Clubs or Organizations: Not on file  . Attends Banker Meetings: Not on file  . Marital Status: Not on file     Family History: The patient's family history includes Arrhythmia in her mother; Diabetes in her maternal grandfather and paternal grandfather; Hypertension in her father, maternal grandfather, mother, and paternal grandfather; Thyroid disease in her mother; Uterine cancer in her paternal grandmother.  ROS:   Review of Systems  Constitution: Negative for decreased appetite, fever and weight gain.  HENT: Negative for congestion, ear discharge, hoarse voice and sore throat.   Eyes: Negative for discharge, redness, vision loss in right eye and visual halos.  Cardiovascular: Negative for chest pain, dyspnea on exertion, leg swelling, orthopnea and palpitations.  Respiratory: Negative for cough, hemoptysis, shortness of breath and snoring.   Endocrine: Negative for heat intolerance and polyphagia.  Hematologic/Lymphatic: Negative for bleeding problem. Does not bruise/bleed easily.  Skin: Negative for flushing, nail changes, rash and suspicious lesions.  Musculoskeletal: Negative  for arthritis, joint pain, muscle cramps, myalgias, neck pain and stiffness.  Gastrointestinal: Negative for abdominal pain, bowel incontinence, diarrhea and excessive appetite.  Genitourinary: Negative for decreased libido, genital sores and incomplete emptying.  Neurological: Negative for brief paralysis, focal weakness, headaches and loss of balance.  Psychiatric/Behavioral: Negative for altered mental status, depression and suicidal ideas.  Allergic/Immunologic: Negative for HIV exposure and persistent infections.    EKGs/Labs/Other Studies Reviewed:    The following studies were reviewed today:   EKG: None today  ZIO monitor  The patient wore the monitor for 11 days 11 hours starting 08/16/2019. Indication: Palpitations  The minimum heart rate was 44 bpm, maximum heart rate was171 bpm, and average heart rate was 75  bpm. Predominant underlying rhythm was Sinus Rhythm.  Premature atrial complexes were rare (<1.0%). Premature Ventricular complexes were rare (<1.0%).  No ventricular tachycardia, No pauses, No AV block, no supraventricular tachycardia and no atrial fibrillation present.  23  patient triggered events: 3 associated with premature ventricular complex, 5 associated with premature atrial complex and the remaining is associated with sinus rhythm and sinus tachycardia. 14 diary events all associate with sinus rhythm and sinus tachycardia.  Conclusion: Unremarkable study.   Transthoracic echocardiogram IMPRESSIONS  eft ventricular ejection fraction, by estimation, is 60 to 65%. The left ventricle has normal function. The left ventricle has no regional wall motion abnormalities. Left ventricular diastolic parameters were  normal.   Recent Labs: 08/27/2019: ALT 19; BUN 10; Creatinine, Ser 0.80; Hemoglobin 14.6; Platelets 212; Potassium 4.2; Sodium 140  Recent Lipid Panel No results found for: CHOL, TRIG, HDL, CHOLHDL, VLDL, LDLCALC, LDLDIRECT  Physical Exam:     VS:  BP 122/70   Pulse 80   Ht 5\' 11"  (1.803 m)   Wt 164 lb 6.4 oz (74.6 kg)   SpO2 99%   BMI 22.93 kg/m     Wt Readings from Last 3 Encounters:  11/17/19 164 lb 6.4 oz (74.6 kg)  08/31/19 160 lb (72.6 kg)  08/16/19 161 lb (73 kg)     GEN: Well nourished, well developed in no acute distress HEENT: Normal NECK: No JVD; No carotid bruits LYMPHATICS: No lymphadenopathy CARDIAC: S1S2 noted,RRR, no murmurs, rubs, gallops RESPIRATORY:  Clear to auscultation without rales, wheezing or rhonchi  ABDOMEN: Soft, non-tender, non-distended, +bowel sounds, no guarding. EXTREMITIES: No edema, No cyanosis, no clubbing MUSCULOSKELETAL:  No deformity  SKIN: Warm and dry NEUROLOGIC:  Alert and oriented x 3, non-focal PSYCHIATRIC:  Normal affect, good insight  ASSESSMENT:    1. Anxiety   2. Palpitations    PLAN:     Her testing was normal.  But she still is having some ringing in her ears with dizziness.  She has not seen ENT which I think she needs to see.  But she is following with her neurologist for migraines given her complicated migraine.  The patient is also going to be seeing a neurologist in 10/16/19.  There is no need for any further cardiovascular work-up at this time as I do think that her symptoms is neurologic or may be in her ear dysfunction.  The patient is in agreement with the above plan. The patient left the office in stable condition.  The patient will follow up in   Medication Adjustments/Labs and Tests Ordered: Current medicines are reviewed at length with the patient today.  Concerns regarding medicines are outlined above.  No orders of the defined types were placed in this encounter.  No orders of the defined types were placed in this encounter.   Patient Instructions  Medication Instructions:  No medication changes. *If you need a refill on your cardiac medications before your next appointment, please call your pharmacy*   Lab Work: None  ordered If you have labs (blood work) drawn today and your tests are completely normal, you will receive your results only by: [place] MyChart Message (if you have MyChart) OR . A paper copy in the mail If you have any lab test that is abnormal or we need to change your treatment, we will call you to review the results.   Testing/Procedures: None ordered   Follow-Up: At [facility], you and your health needs are our priority.  As part of our continuing mission to provide you with exceptional heart care, we have created designated Provider Care Teams.  These Care  Teams include your primary Cardiologist (physician) and Advanced Practice Providers (APPs -  Physician Assistants and Nurse Practitioners) who all work together to provide you with the care you need, when you need it.  We recommend signing up for the patient portal called "MyChart".  Sign up information is provided on this After Visit Summary.  MyChart is used to connect with patients for Virtual Visits (Telemedicine).  Patients are able to view lab/test results, encounter notes, upcoming appointments, etc.  Non-urgent messages can be sent to your provider as well.   To learn more about what you can do with MyChart, go to [email or web address].    Your next appointment:   8 month(s)  The format for your next appointment:   In Person  Provider:   Thomasene RippleKardie Dynasti Kerman, DO   Other Instructions NA     Adopting a Healthy Lifestyle.  Know what a healthy weight is for you (roughly BMI <25) and aim to maintain this   Aim for 7+ servings of fruits and vegetables daily   65-80+ fluid ounces of water or unsweet tea for healthy kidneys   Limit to max 1 drink of alcohol per day; avoid smoking/tobacco   Limit animal fats in diet for cholesterol and heart health - choose grass fed whenever available   Avoid highly processed foods, and foods high in saturated/trans fats   Aim for low stress - take time to unwind and care for your mental  health   Aim for 150 min of moderate intensity exercise weekly for heart health, and weights twice weekly for bone health   Aim for 7-9 hours of sleep daily   When it comes to diets, agreement about the perfect plan isnt easy to find, even among the experts. Experts at the [employer] developed an idea known as the Healthy Eating Plate. Just imagine a plate divided into logical, healthy portions.   The emphasis is on diet quality:   Load up on vegetables and fruits - one-half of your plate: Aim for color and variety, and remember that potatoes dont count.   Go for whole grains - one-quarter of your plate: Whole wheat, barley, wheat berries, quinoa, oats, brown rice, and foods made with them. If you want pasta, go with whole wheat pasta.   Protein power - one-quarter of your plate: Fish, chicken, beans, and nuts are all healthy, versatile protein sources. Limit red meat.   The diet, however, does go beyond the plate, offering a few other suggestions.   Use healthy plant oils, such as olive, canola, soy, corn, sunflower and peanut. Check the labels, and avoid partially hydrogenated oil, which have unhealthy trans fats.   If youre thirsty, drink water. Coffee and tea are good in moderation, but skip sugary drinks and limit milk and dairy products to one or two daily servings.   The type of carbohydrate in the diet is more important than the amount. Some sources of carbohydrates, such as vegetables, fruits, whole grains, and beans-are healthier than others.   Finally, stay active  Signed, Cheryl RippleKardie Alexes Lamarque, DO  11/17/2019 4:42 PM    [facility]

## 2019-12-30 ENCOUNTER — Other Ambulatory Visit: Payer: Self-pay | Admitting: Physician Assistant

## 2019-12-30 DIAGNOSIS — R202 Paresthesia of skin: Secondary | ICD-10-CM

## 2019-12-30 DIAGNOSIS — R32 Unspecified urinary incontinence: Secondary | ICD-10-CM

## 2019-12-30 DIAGNOSIS — R296 Repeated falls: Secondary | ICD-10-CM

## 2020-01-03 ENCOUNTER — Other Ambulatory Visit: Payer: Self-pay | Admitting: Diagnostic Neuroimaging

## 2020-01-21 ENCOUNTER — Other Ambulatory Visit: Payer: Self-pay | Admitting: Diagnostic Neuroimaging

## 2020-02-16 ENCOUNTER — Ambulatory Visit
Admission: RE | Admit: 2020-02-16 | Discharge: 2020-02-16 | Disposition: A | Discharge status: Home / Self Care | Payer: BC Managed Care – PPO | Source: Ambulatory Visit | Attending: Physician Assistant | Admitting: Physician Assistant

## 2020-02-16 ENCOUNTER — Other Ambulatory Visit: Payer: Self-pay

## 2020-02-16 DIAGNOSIS — R202 Paresthesia of skin: Secondary | ICD-10-CM

## 2020-02-16 DIAGNOSIS — R32 Unspecified urinary incontinence: Secondary | ICD-10-CM

## 2020-02-16 DIAGNOSIS — R296 Repeated falls: Secondary | ICD-10-CM

## 2020-02-16 IMAGING — MR MR CERVICAL SPINE WO/W CM
5 of 8 series · 25 of 48 positions shown · IV contrast (multihance)
Comparison: Comparison made with previous brain MRI from
[DATE].
COMPARISON: Comparison made with previous brain MRI from
[DATE].

Addendum:
CLINICAL DATA: Initial evaluation for blurry vision, I spasms,
ringing in ears, frequent falls.

EXAM:
MRI CERVICAL SPINE WITHOUT AND WITH CONTRAST
TECHNIQUE: Multiplanar and multiecho pulse sequences of the cervical spine, to
include the craniocervical junction and cervicothoracic junction,
were obtained without and with intravenous contrast.
CONTRAST:  15mL MULTIHANCE GADOBENATE DIMEGLUMINE 529 MG/ML IV SOLN

[Series 3: T1 · sagittal · 3.0mm · 0.41mm/px · 4 of 13 slices shown (1 of 2)]
[im 1/13]
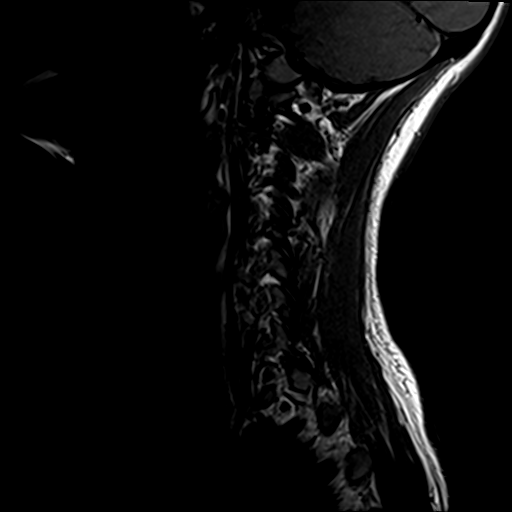
[im 5/13]
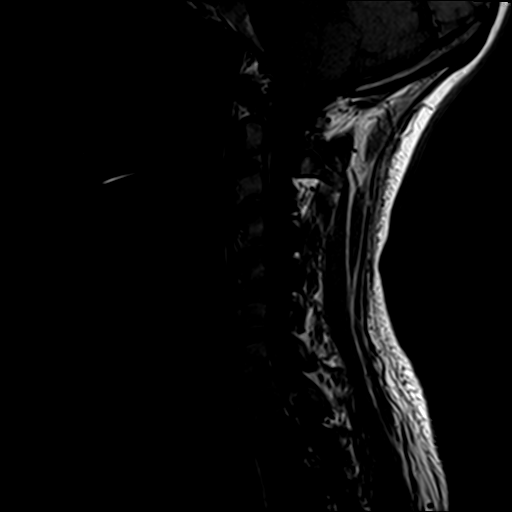
[im 9/13]
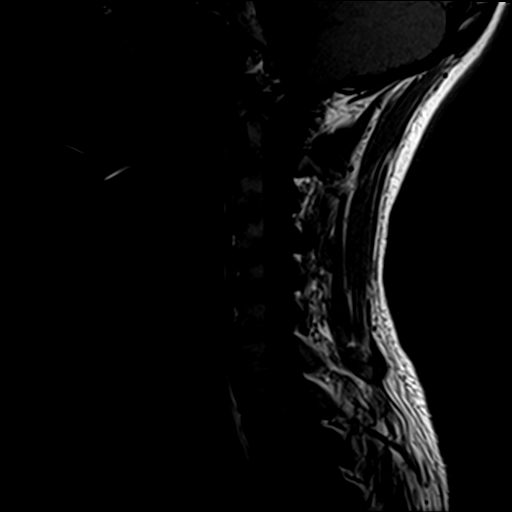
[im 13/13]
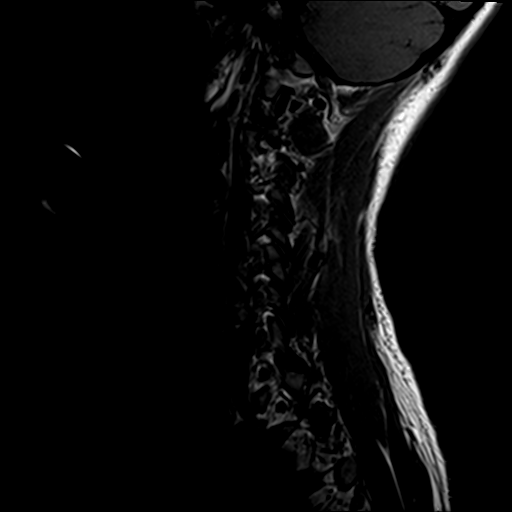

[Series 5: T2 · axial · 3.0mm · 0.70mm/px · z∈[-52,+52]mm · 8 of 29 slices shown (1 of 2)]
[im 1/29]
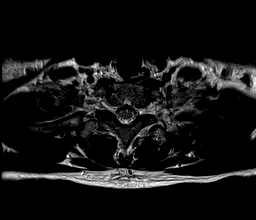
[im 5/29]
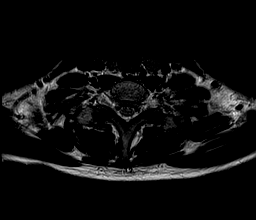
[im 9/29]
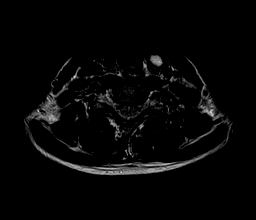
[im 13/29]
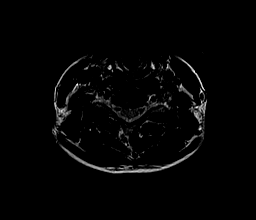
[im 17/29]
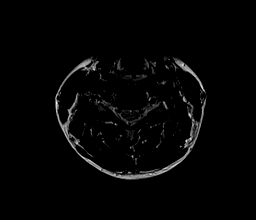
[im 21/29]
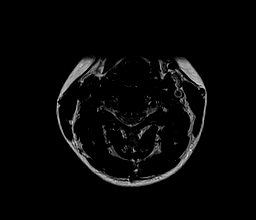
[im 25/29]
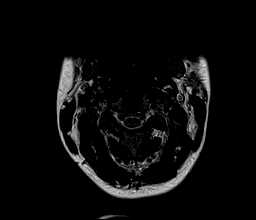
[im 29/29]
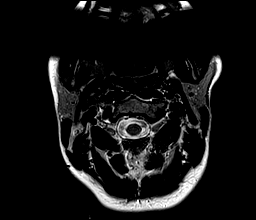

[Series 6: T2 · sagittal · 3.0mm · 0.41mm/px · 4 of 13 slices shown (2 of 2)]
[im 1/13]
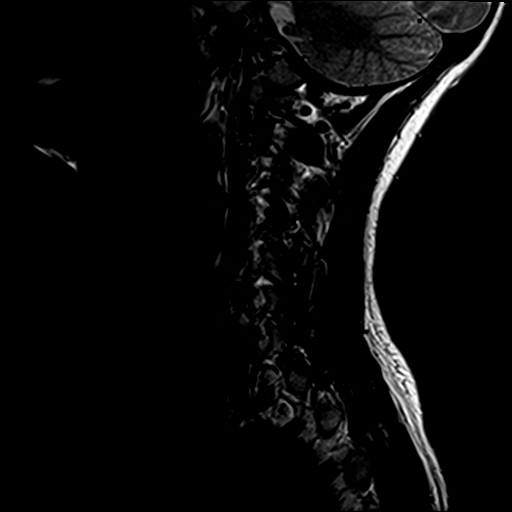
[im 5/13]
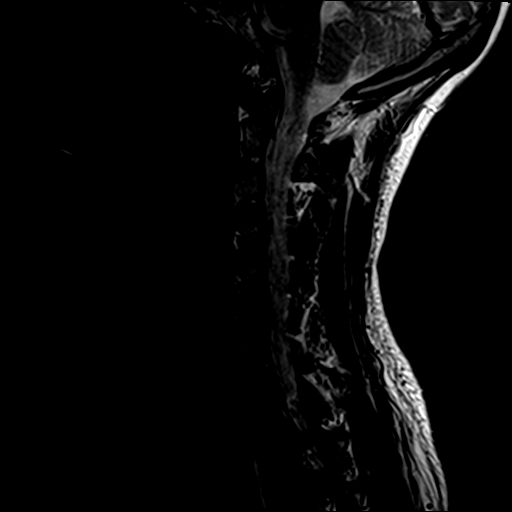
[im 9/13]
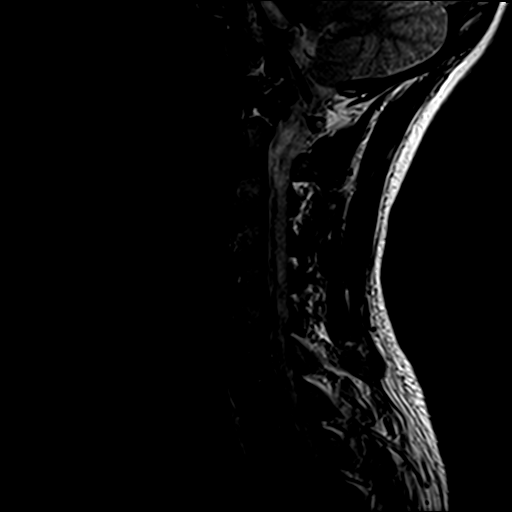
[im 13/13]
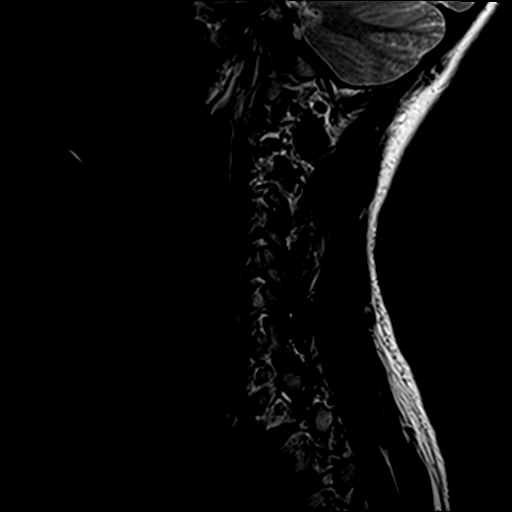

[Series 7: T1 · axial · 3.0mm · 0.35mm/px · z∈[-52,+52]mm · 8 of 29 slices shown (2 of 2)]
[im 1/29]
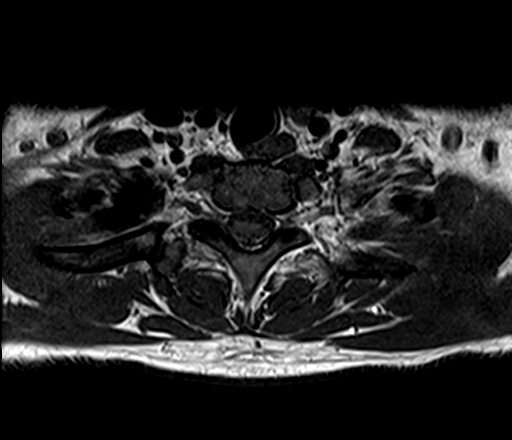
[im 5/29]
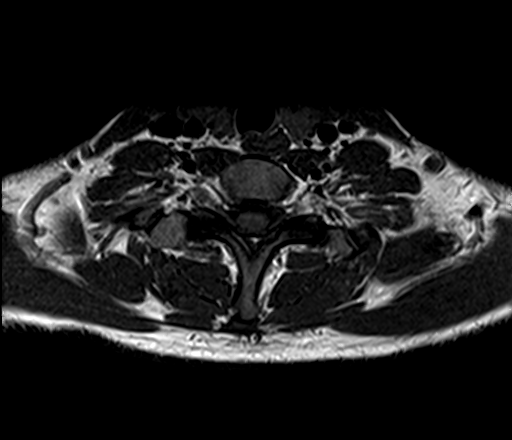
[im 9/29]
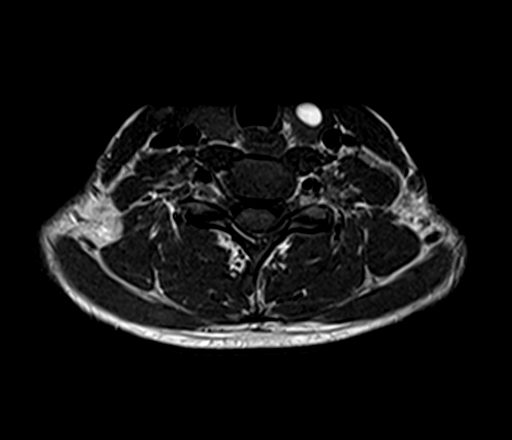
[im 13/29]
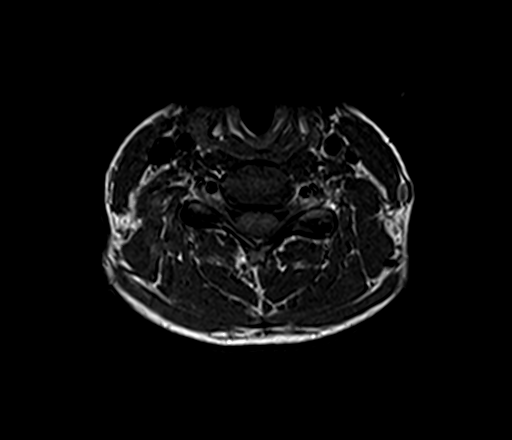
[im 17/29]
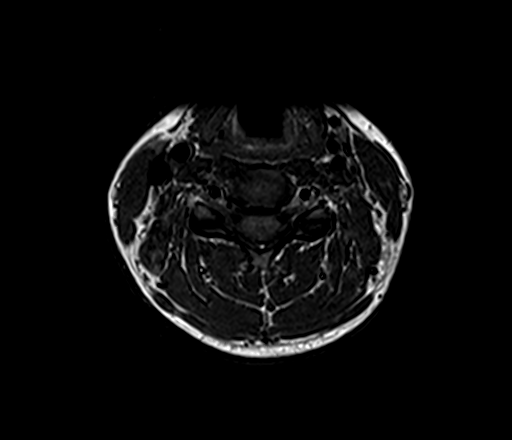
[im 21/29]
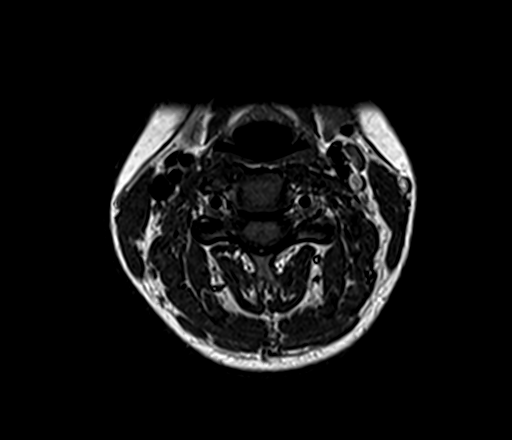
[im 25/29]
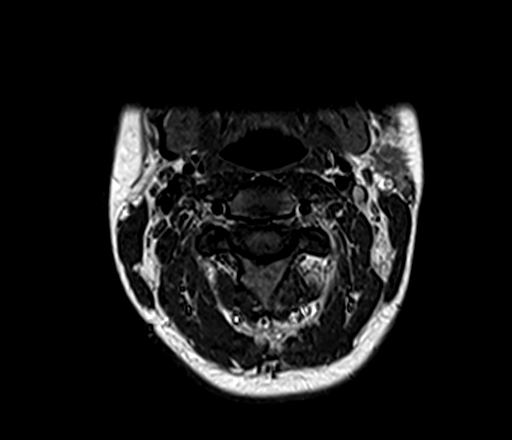
[im 29/29]
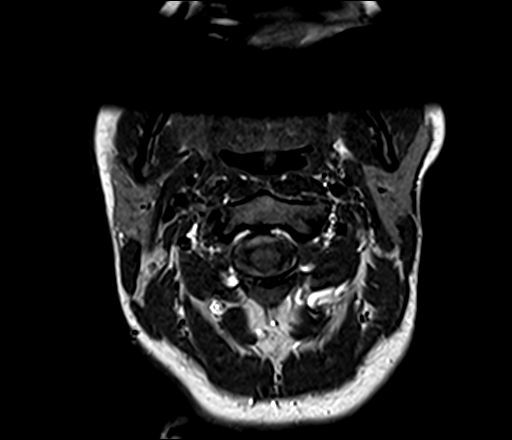

[Series 17: T1 fat-sat post-contrast · sagittal · 3.0mm · 0.82mm/px · 1 of 13 slices shown]
[im 1/13]
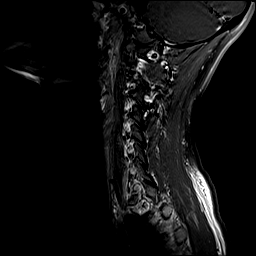

[25 of 48 positions shown; findings below may reference images not displayed]

FINDINGS: Alignment: Straightening of the normal cervical lordosis. No
listhesis.

Vertebrae: Vertebral body height well maintained without evidence
for acute or chronic fracture. Bone marrow signal intensity within
normal limits. No discrete or worrisome osseous lesions. Reactive
marrow edema seen about the right greater than left T1-2 facets,
likely due to facet arthritis (series 2, images 3, 11). No other
abnormal marrow edema or enhancement.

Cord: Signal intensity within the cervical spinal cord is within
normal limits. No cord signal changes to suggest demyelinating
disease. Normal cord caliber and morphology. No abnormal
enhancement.

Posterior Fossa, vertebral arteries, paraspinal tissues: Visualized
brain and posterior fossa within normal limits. Craniocervical
junction normal. Paraspinous and prevertebral soft tissues within
normal limits. Normal flow voids seen within the vertebral arteries
bilaterally. 1 cm T2/T1 hyperintense nodule present within the left
thyroid lobe (series 5, image 22).

Disc levels:

No significant disc pathology seen within the cervical spine. No
disc bulge or focal disc herniation. No significant canal or
foraminal stenosis. No impingement.
IMPRESSION: 1. Reactive marrow edema about the right greater than left T1-2
facets, likely due to facet arthritis. Finding could serve as a
source for neck/upper back pain.
2. Otherwise normal MRI appearance of the cervical spine and spinal
cord. No evidence for demyelinating disease. No significant disc
pathology or stenosis. No neural impingement.

ADDENDUM:
In addition to the initially described findings, note is made of a 1
cm left thyroid nodule, described in the findings portion of the
report, omitted from the impression by mistake:

1 cm left thyroid nodule, indeterminate. Further evaluation with
dedicated thyroid ultrasound recommended. (Ref: [HOSPITAL].
[DATE]): 143-50).

*** End of Addendum ***
FINDINGS: Alignment: Straightening of the normal cervical lordosis. No
listhesis.

Vertebrae: Vertebral body height well maintained without evidence
for acute or chronic fracture. Bone marrow signal intensity within
normal limits. No discrete or worrisome osseous lesions. Reactive
marrow edema seen about the right greater than left T1-2 facets,
likely due to facet arthritis (series 2, images 3, 11). No other
abnormal marrow edema or enhancement.

Cord: Signal intensity within the cervical spinal cord is within
normal limits. No cord signal changes to suggest demyelinating
disease. Normal cord caliber and morphology. No abnormal
enhancement.

Posterior Fossa, vertebral arteries, paraspinal tissues: Visualized
brain and posterior fossa within normal limits. Craniocervical
junction normal. Paraspinous and prevertebral soft tissues within
normal limits. Normal flow voids seen within the vertebral arteries
bilaterally. 1 cm T2/T1 hyperintense nodule present within the left
thyroid lobe (series 5, image 22).

Disc levels:

No significant disc pathology seen within the cervical spine. No
disc bulge or focal disc herniation. No significant canal or
foraminal stenosis. No impingement.
IMPRESSION: 1. Reactive marrow edema about the right greater than left T1-2
facets, likely due to facet arthritis. Finding could serve as a
source for neck/upper back pain.
2. Otherwise normal MRI appearance of the cervical spine and spinal
cord. No evidence for demyelinating disease. No significant disc
pathology or stenosis. No neural impingement.

## 2020-02-16 IMAGING — MR MR LUMBAR SPINE WO/W CM
4 of 7 series · 23 of 48 positions shown · IV contrast (multihance)
Comparison: None available.

CLINICAL DATA: Initial evaluation for blurry vision, I spasms,
ringing in ears, frequent falls.

EXAM:
MRI LUMBAR SPINE WITHOUT AND WITH CONTRAST
TECHNIQUE: Multiplanar and multiecho pulse sequences of the lumbar spine were
obtained without and with intravenous contrast.
CONTRAST:  15mL MULTIHANCE GADOBENATE DIMEGLUMINE 529 MG/ML IV SOLN

[Series 3: T1 · sagittal · 4.0mm · 0.88mm/px · 3 of 14 slices shown (1 of 2)]
[im 1/14]
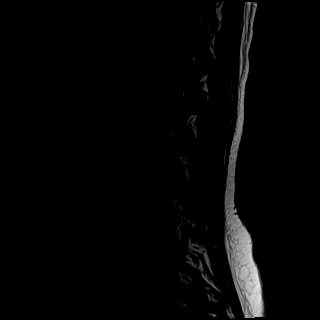
[im 7/14]
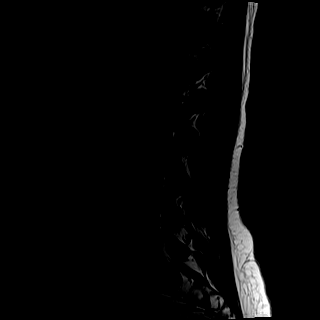
[im 14/14]
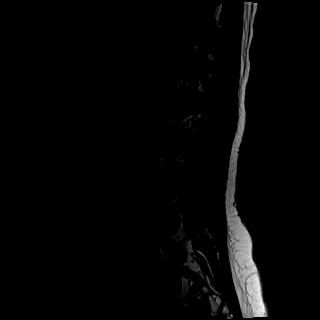

[Series 5: T2 · axial · 4.0mm · 0.39mm/px · z∈[-508,-275]mm · 11 of 43 slices shown (1 of 2)]
[im 1/43]
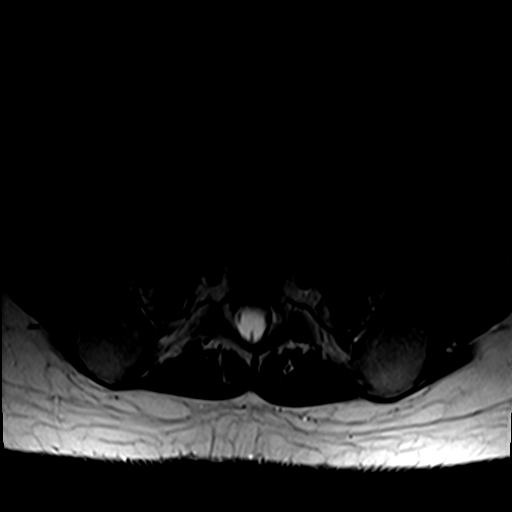
[im 5/43]
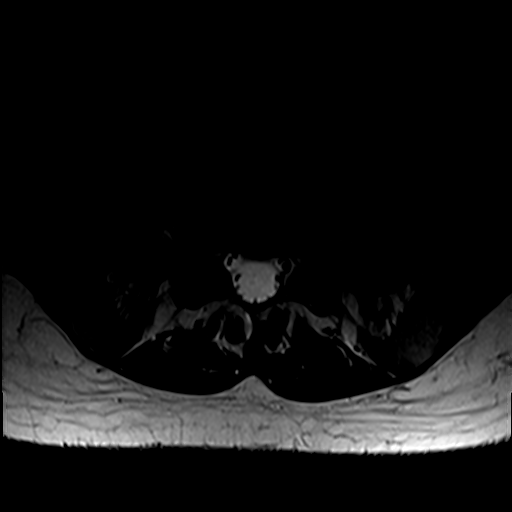
[im 9/43]
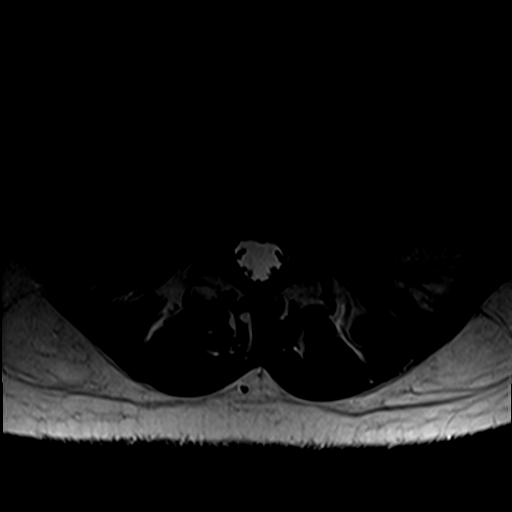
[im 13/43]
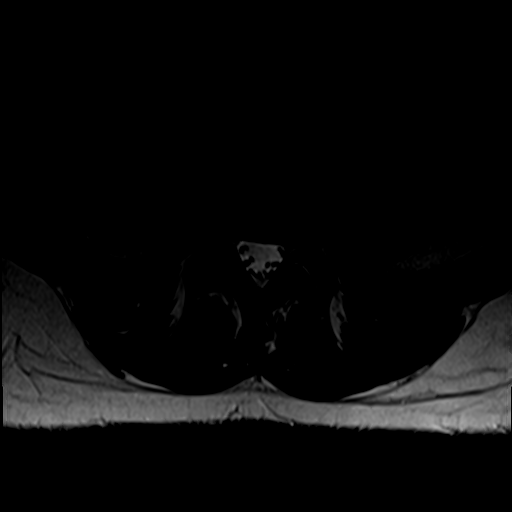
[im 17/43]
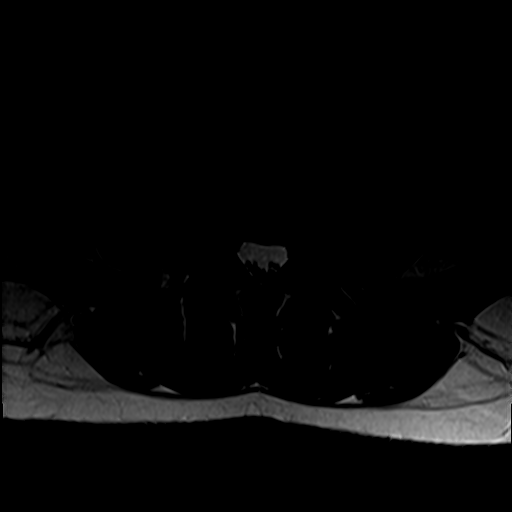
[im 22/43]
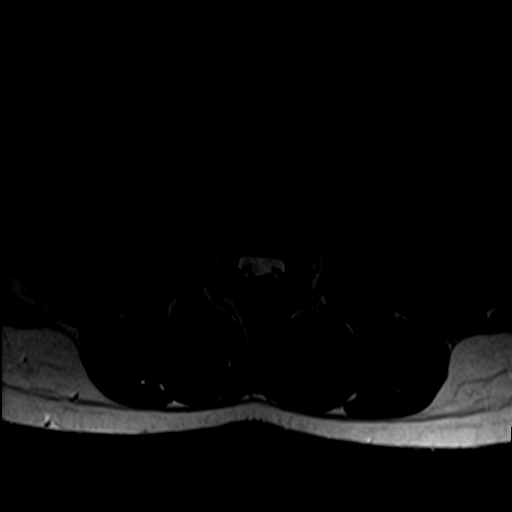
[im 26/43]
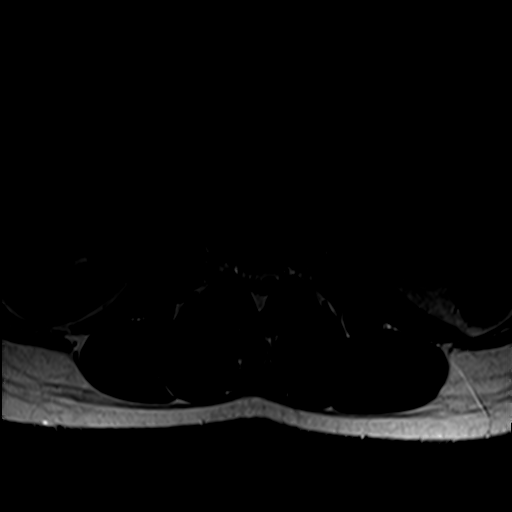
[im 30/43]
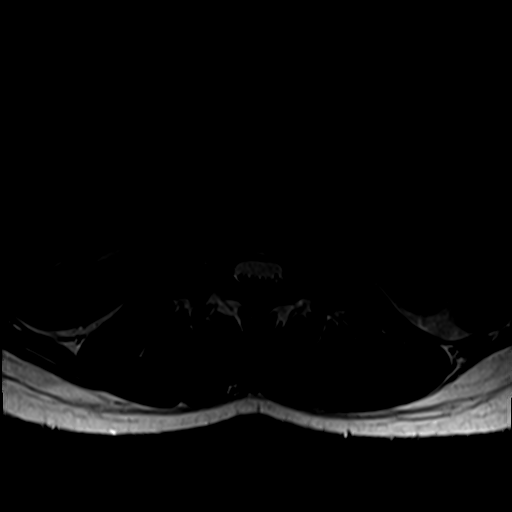
[im 34/43]
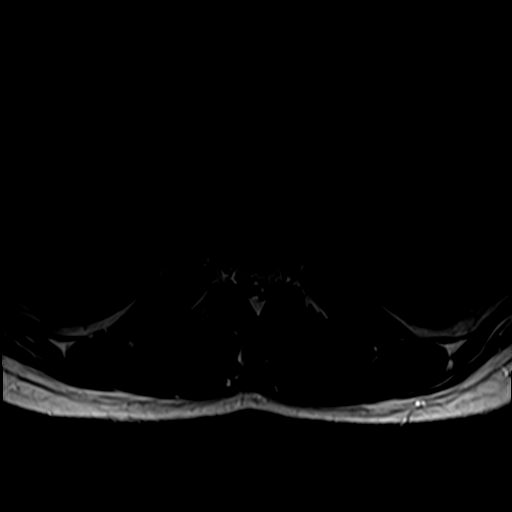
[im 38/43]
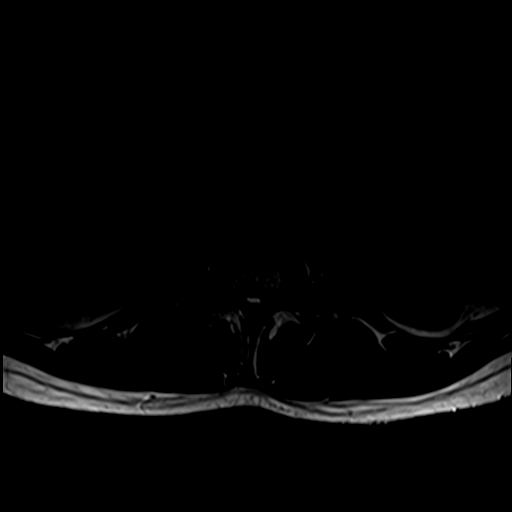
[im 43/43]
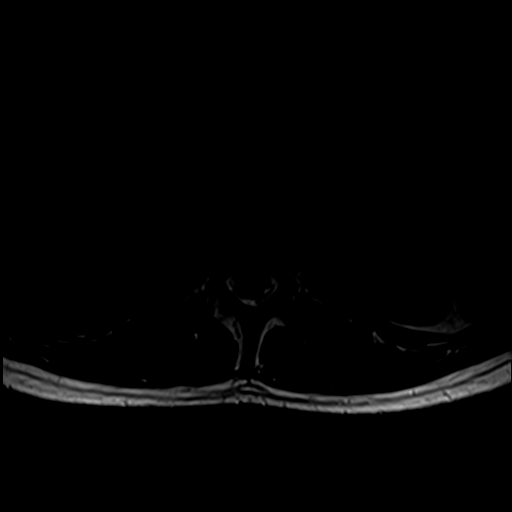

[Series 6: T1 · axial · 4.0mm · 0.39mm/px · z∈[-508,-299]mm · 5 of 43 slices shown (2 of 2)]
[im 1/43]
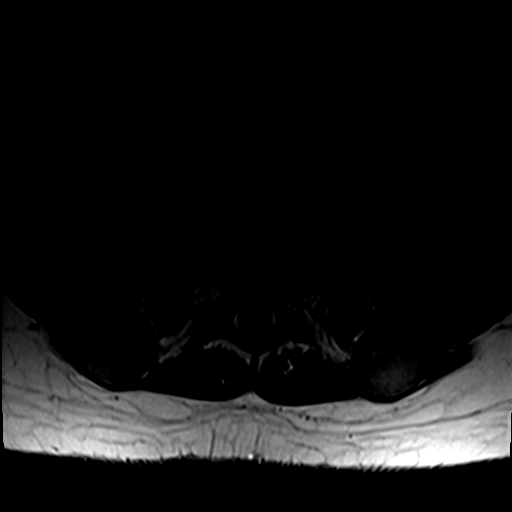
[im 5/43]
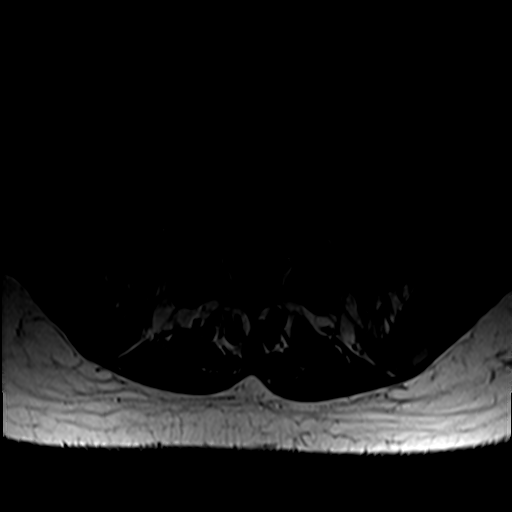
[im 9/43]
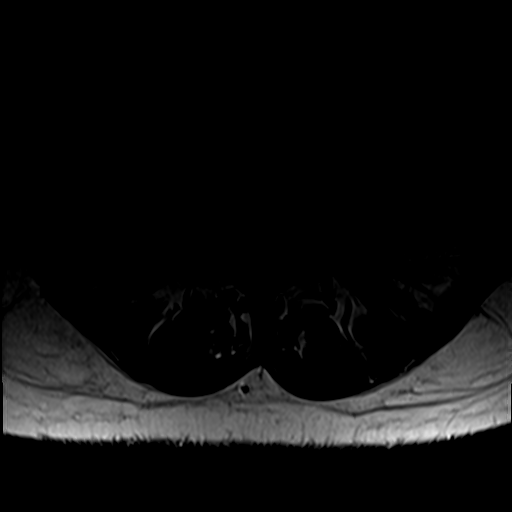
[im 22/43]
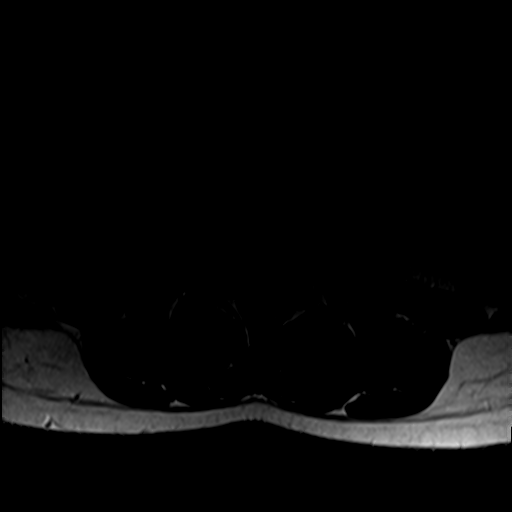
[im 38/43]
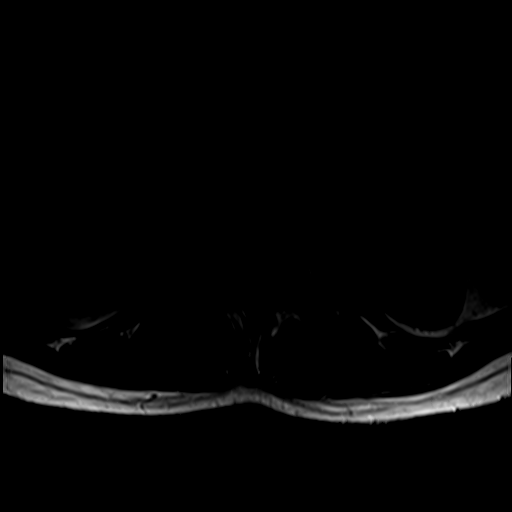

[Series 7: T2 · sagittal · 4.0mm · 1.09mm/px · 4 of 14 slices shown (2 of 2)]
[im 1/14]
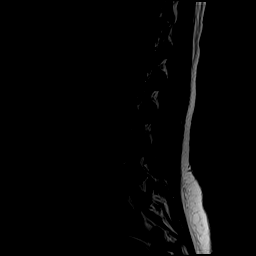
[im 5/14]
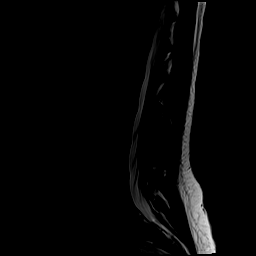
[im 9/14]
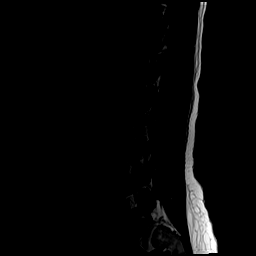
[im 14/14]
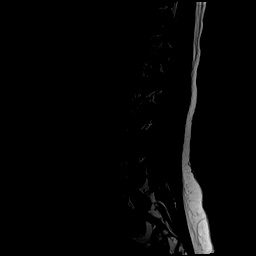

[23 of 48 positions shown; findings below may reference images not displayed]

FINDINGS: Segmentation: Standard. Lowest well-formed disc space labeled the
L5-S1 level.

Alignment: Physiologic with preservation of the normal lumbar
lordosis. No listhesis.

Vertebrae: Vertebral body height well maintained without acute or
chronic fracture. Bone marrow signal intensity within normal limits.
No discrete or worrisome osseous lesions. No abnormal marrow edema
or enhancement.

Conus medullaris and cauda equina: Conus extends to the L1-2 level.
Conus and cauda equina appear normal. No abnormal enhancement.

Paraspinal and other soft tissues: Paraspinous soft tissues within
normal limits. Visualized visceral structures are normal.

Disc levels:

No significant disc pathology seen within the lumbar spine.
Intervertebral discs are well hydrated with preserved disc height.
No disc bulge or focal disc herniation. No significant facet
disease. No canal or neural foraminal stenosis or evidence for
neural impingement.
IMPRESSION: Normal MRI of the lumbar spine, with and without contrast.

## 2020-02-16 MED ORDER — GADOBENATE DIMEGLUMINE 529 MG/ML IV SOLN
15.0000 mL | Freq: Once | INTRAVENOUS | Status: AC | PRN
  Administered 2020-02-16: 15 mL via INTRAVENOUS

## 2020-03-02 ENCOUNTER — Ambulatory Visit: Payer: BC Managed Care – PPO | Admitting: Family Medicine

## 2020-04-10 DIAGNOSIS — F902 Attention-deficit hyperactivity disorder, combined type: Secondary | ICD-10-CM | POA: Insufficient documentation

## 2020-04-12 ENCOUNTER — Ambulatory Visit (INDEPENDENT_AMBULATORY_CARE_PROVIDER_SITE_OTHER): Payer: BC Managed Care – PPO | Admitting: Cardiology

## 2020-04-12 ENCOUNTER — Encounter: Payer: Self-pay | Admitting: Cardiology

## 2020-04-12 ENCOUNTER — Other Ambulatory Visit: Payer: Self-pay

## 2020-04-12 VITALS — BP 120/68 | HR 95 | Ht 71.0 in | Wt 162.6 lb

## 2020-04-12 DIAGNOSIS — R42 Dizziness and giddiness: Secondary | ICD-10-CM

## 2020-04-12 DIAGNOSIS — R55 Syncope and collapse: Secondary | ICD-10-CM

## 2020-04-12 DIAGNOSIS — R002 Palpitations: Secondary | ICD-10-CM | POA: Diagnosis not present

## 2020-04-12 MED ORDER — PROPRANOLOL HCL 20 MG PO TABS: 20.0000 mg | ORAL_TABLET | Freq: Three times a day (TID) | ORAL | 4 refills | Status: DC

## 2020-04-12 MED ORDER — MIDODRINE HCL 2.5 MG PO TABS: 2.5000 mg | ORAL_TABLET | Freq: Three times a day (TID) | ORAL | 4 refills | Status: DC

## 2020-04-12 NOTE — Patient Instructions (Signed)
Medication Instructions:  Your physician has recommended you make the following change in your medication: START: Propranolol 20 mg every 8 hours START: Midodrine 2.5 mg every 8 hours *If you need a refill on your cardiac medications before your next appointment, please call your pharmacy*   Lab Work: None If you have labs (blood work) drawn today and your tests are completely normal, you will receive your results only by: [place] MyChart Message (if you have MyChart) OR . A paper copy in the mail If you have any lab test that is abnormal or we need to change your treatment, we will call you to review the results.   Testing/Procedures: None   Follow-Up: At [facility], you and your health needs are our priority.  As part of our continuing mission to provide you with exceptional heart care, we have created designated Provider Care Teams.  These Care Teams include your primary Cardiologist (physician) and Advanced Practice Providers (APPs -  Physician Assistants and Nurse Practitioners) who all work together to provide you with the care you need, when you need it.  We recommend signing up for the patient portal called "MyChart".  Sign up information is provided on this After Visit Summary.  MyChart is used to connect with patients for Virtual Visits (Telemedicine).  Patients are able to view lab/test results, encounter notes, upcoming appointments, etc.  Non-urgent messages can be sent to your provider as well.   To learn more about what you can do with MyChart, go to [email or web address].    Your next appointment:   8 week(s)  The format for your next appointment:   In Person  Provider:   Thomasene Ripple, DO   Other Instructions

## 2020-04-12 NOTE — Progress Notes (Signed)
Cardiology Office Note:    Date:  04/12/2020   ID:  Cheryl Andrews, DOB May 26, 1995, MRN [id number]  PCP:  Adrienne Mocha, PA  Cardiologist:  Thomasene Ripple, DO  Electrophysiologist:  None   Referring MD: Adrienne Mocha, PA    I have had a syncope episode since I saw you with palpitations.   History of Present Illness:    Cheryl Andrews is a 25 y.o. female with a hx of ADHD, anxiety who initially presented due to palpitations syncope episodes.  At that time we will place a monitor on the patient which did not reveal any significant palpitations as well as her echocardiogram was normal.  At her last visit in November 2021 she reported that she had been experiencing ringing in her hears which then made her feel weak she would sit down after the spring and she will pass out.  In addition she told me that she had followed with an [facility] neurologist but was not satisfied and was going to see a migraine specialist in [place] Dr. Allena Katz. Giving her symptoms of ringing in her ears before she passed out I recommended that the patient see ENT to be fully evaluated.  I also asked the patient to stick with that neurology follow-up in [place].  Today she is here for follow-up visit.  She had not been able to see ENT she says she did have a right and definitely not been able to go to see Dr. Allena Katz in [facility] or another neurologist that she had wanted to see intake.  She still has had some palpitations.  She tells me that she feels palpitations every day and sometimes she feels as if she is going to pass out.  But her true syncope episodes associated with the fact that she feels ringing in her ears and then feel flaccid and passes out.  Past Medical History:  Diagnosis Date  . ADHD (attention deficit hyperactivity disorder), combined type   . Anxiety   . Asthma   . Attention deficit hyperactivity disorder 12/26/2014  . Atypical chest pain 08/16/2019  . Chronic  cluster headache 06/25/2013  . Chronic diarrhea   . Chronic migraine 07/20/2019  . Interstitial cystitis   . MDD (major depressive disorder), recurrent episode, moderate (HCC) 09/29/2018  . Migraine without aura and without status migrainosus, not intractable 07/20/2019  . Palpitations 08/16/2019  . Syncope and collapse 08/16/2019  . Trigeminal autonomic cephalgias     Past Surgical History:  Procedure Laterality Date  . TONSILLECTOMY  2002  . WISDOM TOOTH EXTRACTION  2016    Current Medications: Current Meds  Medication Sig  . midodrine (PROAMATINE) 2.5 MG tablet Take 1 tablet (2.5 mg total) by mouth 3 (three) times daily with meals.  . propranolol (INDERAL) 20 MG tablet Take 1 tablet (20 mg total) by mouth every 8 (eight) hours.     Allergies:   Paba derivatives and Protonix [pantoprazole]   Social History   Socioeconomic History  . Marital status: Married    Spouse name: Not on file  . Number of children: 0  . Years of education: Not on file  . Highest education level: High school graduate  Occupational History  . Not on file  Tobacco Use  . Smoking status: Former Smoker    Types: Cigarettes    Quit date: 01/07/2017    Years since quitting: 3.2  . Smokeless tobacco: Never Used  Substance and Sexual Activity  . Alcohol use:  Never  . Drug use: Never  . Sexual activity: Not on file  Other Topics Concern  . Not on file  Social History Narrative   Lives with husband   Caffeine- coffee 1 c daily   Social Determinants of Health   Financial Resource Strain: Not on file  Food Insecurity: Not on file  Transportation Needs: Not on file  Physical Activity: Not on file  Stress: Not on file  Social Connections: Not on file     Family History: The patient's family history includes Arrhythmia in her mother; Diabetes in her maternal grandfather and paternal grandfather; Hypertension in her father, maternal grandfather, mother, and paternal grandfather; Thyroid disease in her  mother; Uterine cancer in her paternal grandmother.  ROS:   Review of Systems  Constitution: Negative for decreased appetite, fever and weight gain.  HENT: Negative for congestion, ear discharge, hoarse voice and sore throat.   Eyes: Negative for discharge, redness, vision loss in right eye and visual halos.  Cardiovascular: Negative for chest pain, dyspnea on exertion, leg swelling, orthopnea and palpitations.  Respiratory: Negative for cough, hemoptysis, shortness of breath and snoring.   Endocrine: Negative for heat intolerance and polyphagia.  Hematologic/Lymphatic: Negative for bleeding problem. Does not bruise/bleed easily.  Skin: Negative for flushing, nail changes, rash and suspicious lesions.  Musculoskeletal: Negative for arthritis, joint pain, muscle cramps, myalgias, neck pain and stiffness.  Gastrointestinal: Negative for abdominal pain, bowel incontinence, diarrhea and excessive appetite.  Genitourinary: Negative for decreased libido, genital sores and incomplete emptying.  Neurological: Negative for brief paralysis, focal weakness, headaches and loss of balance.  Psychiatric/Behavioral: Negative for altered mental status, depression and suicidal ideas.  Allergic/Immunologic: Negative for HIV exposure and persistent infections.    EKGs/Labs/Other Studies Reviewed:    The following studies were reviewed today:   EKG: None today  ZIO monitor The patient wore the monitor for 11 days 11 hours starting 08/16/2019. Indication: Palpitations  The minimum heart rate was 44 bpm, maximum heart rate was171 bpm, and average heart rate was 75 bpm. Predominant underlying rhythm was Sinus Rhythm.  Premature atrial complexes were rare (<1.0%). Premature Ventricular complexes were rare (<1.0%).  No ventricular tachycardia, No pauses, No AV block, no supraventricular tachycardia and no atrial fibrillation present.  23 patient triggered events: 3 associated with premature  ventricular complex, 5 associated with premature atrial complex and the remaining is associated with sinus rhythm and sinus tachycardia. 14 diary events all associate with sinus rhythm and sinus tachycardia.  Conclusion: Unremarkable study.   Transthoracic echocardiogram IMPRESSIONS  eft ventricular ejection fraction, by estimation, is 60 to 65%. The left ventricle has normal function. The left ventricle has no regional wall motion abnormalities. Left ventricular diastolic parameters were  normal.    Recent Labs: 08/27/2019: ALT 19; BUN 10; Creatinine, Ser 0.80; Hemoglobin 14.6; Platelets 212; Potassium 4.2; Sodium 140  Recent Lipid Panel No results found for: CHOL, TRIG, HDL, CHOLHDL, VLDL, LDLCALC, LDLDIRECT  Physical Exam:    VS:  BP 120/68   Pulse 95   Ht 5\' 11"  (1.803 m)   Wt 162 lb 9.6 oz (73.8 kg)   SpO2 98%   BMI 22.68 kg/m     Wt Readings from Last 3 Encounters:  04/12/20 162 lb 9.6 oz (73.8 kg)  11/17/19 164 lb 6.4 oz (74.6 kg)  08/31/19 160 lb (72.6 kg)     GEN: Well nourished, well developed in no acute distress HEENT: Normal NECK: No JVD; No carotid bruits LYMPHATICS: No  lymphadenopathy CARDIAC: S1S2 noted,RRR, no murmurs, rubs, gallops RESPIRATORY:  Clear to auscultation without rales, wheezing or rhonchi  ABDOMEN: Soft, non-tender, non-distended, +bowel sounds, no guarding. EXTREMITIES: No edema, No cyanosis, no clubbing MUSCULOSKELETAL:  No deformity  SKIN: Warm and dry NEUROLOGIC:  Alert and oriented x 3, non-focal PSYCHIATRIC:  Normal affect, good insight  ASSESSMENT:    1. Palpitations   2. Syncope and collapse   3. Dizziness    PLAN:     She has not been able to follow-up with any other subspecialty since the last time she saw me.  She needs to see ENT she needs to see neurology.  Her symptoms still involve the ringing in the ears and I still do think that she needs to see ENT and neurology.  Recommend refer the patient to ENT.  She already  have a contact neurologist.  Given the fact that she reports that she is having significant palpitations while like to do is start her on low-dose propanolol along with midodrine to make sure and see if this is going to help. Orthostatics was done in the office today and they were negative.  Hopefully she can be evaluated by ENT prior to her next visit.  Should his evaluation also be normal and she does not respond to the propanolol may return I will refer the patient to EP as well for possible loop recorder.  The patient is in agreement with the above plan. The patient left the office in stable condition.  The patient will follow up in 8 weeks or sooner if needed.   Medication Adjustments/Labs and Tests Ordered: Current medicines are reviewed at length with the patient today.  Concerns regarding medicines are outlined above.  Orders Placed This Encounter  Procedures  . Ambulatory referral to ENT   Meds ordered this encounter  Medications  . propranolol (INDERAL) 20 MG tablet    Sig: Take 1 tablet (20 mg total) by mouth every 8 (eight) hours.    Dispense:  216 tablet    Refill:  4  . midodrine (PROAMATINE) 2.5 MG tablet    Sig: Take 1 tablet (2.5 mg total) by mouth 3 (three) times daily with meals.    Dispense:  216 tablet    Refill:  4    Patient Instructions  Medication Instructions:  Your physician has recommended you make the following change in your medication: START: Propranolol 20 mg every 8 hours START: Midodrine 2.5 mg every 8 hours *If you need a refill on your cardiac medications before your next appointment, please call your pharmacy*   Lab Work: None If you have labs (blood work) drawn today and your tests are completely normal, you will receive your results only by: Marland Kitchen MyChart Message (if you have MyChart) OR . A paper copy in the mail If you have any lab test that is abnormal or we need to change your treatment, we will call you to review the  results.   Testing/Procedures: None   Follow-Up: At [facility], you and your health needs are our priority.  As part of our continuing mission to provide you with exceptional heart care, we have created designated Provider Care Teams.  These Care Teams include your primary Cardiologist (physician) and Advanced Practice Providers (APPs -  Physician Assistants and Nurse Practitioners) who all work together to provide you with the care you need, when you need it.  We recommend signing up for the patient portal called "MyChart".  Sign up information is  provided on this After Visit Summary.  MyChart is used to connect with patients for Virtual Visits (Telemedicine).  Patients are able to view lab/test results, encounter notes, upcoming appointments, etc.  Non-urgent messages can be sent to your provider as well.   To learn more about what you can do with MyChart, go to [email or web address].    Your next appointment:   8 week(s)  The format for your next appointment:   In Person  Provider:   Thomasene Ripple, DO   Other Instructions      Adopting a Healthy Lifestyle.  Know what a healthy weight is for you (roughly BMI <25) and aim to maintain this   Aim for 7+ servings of fruits and vegetables daily   65-80+ fluid ounces of water or unsweet tea for healthy kidneys   Limit to max 1 drink of alcohol per day; avoid smoking/tobacco   Limit animal fats in diet for cholesterol and heart health - choose grass fed whenever available   Avoid highly processed foods, and foods high in saturated/trans fats   Aim for low stress - take time to unwind and care for your mental health   Aim for 150 min of moderate intensity exercise weekly for heart health, and weights twice weekly for bone health   Aim for 7-9 hours of sleep daily   When it comes to diets, agreement about the perfect plan isnt easy to find, even among the experts. Experts at the [facility] [employer] developed  an idea known as the Healthy Eating Plate. Just imagine a plate divided into logical, healthy portions.   The emphasis is on diet quality:   Load up on vegetables and fruits - one-half of your plate: Aim for color and variety, and remember that potatoes dont count.   Go for whole grains - one-quarter of your plate: Whole wheat, barley, wheat berries, quinoa, oats, brown rice, and foods made with them. If you want pasta, go with whole wheat pasta.   Protein power - one-quarter of your plate: Fish, chicken, beans, and nuts are all healthy, versatile protein sources. Limit red meat.   The diet, however, does go beyond the plate, offering a few other suggestions.   Use healthy plant oils, such as olive, canola, soy, corn, sunflower and peanut. Check the labels, and avoid partially hydrogenated oil, which have unhealthy trans fats.   If youre thirsty, drink water. Coffee and tea are good in moderation, but skip sugary drinks and limit milk and dairy products to one or two daily servings.   The type of carbohydrate in the diet is more important than the amount. Some sources of carbohydrates, such as vegetables, fruits, whole grains, and beans-are healthier than others.   Finally, stay active  Signed, Thomasene Ripple, DO  04/12/2020 5:09 PM    [facility]

## 2020-05-03 ENCOUNTER — Other Ambulatory Visit: Payer: Self-pay | Admitting: Physician Assistant

## 2020-05-03 DIAGNOSIS — R296 Repeated falls: Secondary | ICD-10-CM

## 2020-05-04 ENCOUNTER — Other Ambulatory Visit: Payer: Self-pay | Admitting: Physician Assistant

## 2020-05-04 DIAGNOSIS — R296 Repeated falls: Secondary | ICD-10-CM

## 2020-05-18 ENCOUNTER — Ambulatory Visit
Admission: RE | Admit: 2020-05-18 | Discharge: 2020-05-18 | Disposition: A | Discharge status: Home / Self Care | Payer: BC Managed Care – PPO | Source: Ambulatory Visit | Attending: Physician Assistant | Admitting: Physician Assistant

## 2020-05-18 ENCOUNTER — Other Ambulatory Visit: Payer: Self-pay

## 2020-05-18 DIAGNOSIS — R296 Repeated falls: Secondary | ICD-10-CM

## 2020-05-18 IMAGING — MR MR HEAD WO/W CM
13 series · 48 of 48 positions shown · IV contrast (multihance)
Comparison: Previous studies from [DATE] and [DATE]

CLINICAL DATA: Initial evaluation for bladder dysfunction, burning
and weakness in both legs, arm pain and weakness. Headaches, neck
pain and weakness. Difficulty swallowing, syncope, vertigo.

EXAM:
MRI HEAD WITHOUT AND WITH CONTRAST
MRI CERVICAL SPINE WITHOUT AND WITH CONTRAST
TECHNIQUE: Multiplanar, multiecho pulse sequences of the brain and surrounding
structures, and cervical spine, to include the craniocervical
junction and cervicothoracic junction, were obtained without and
with intravenous contrast.
CONTRAST:  15mL MULTIHANCE GADOBENATE DIMEGLUMINE 529 MG/ML IV SOLN

[Series 2: T1 · sagittal · 5.0mm · 0.45mm/px · 1 of 23 slices shown]
[im 1/23]
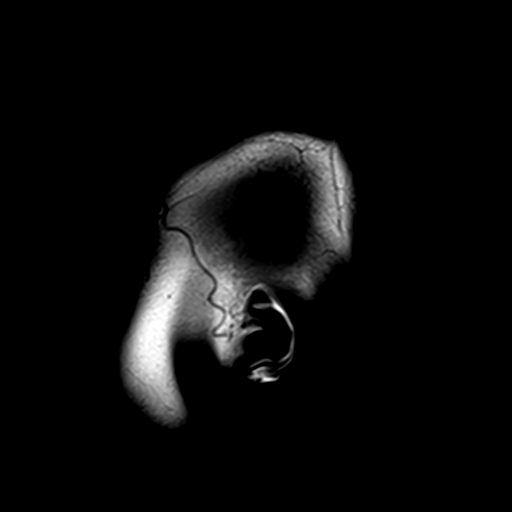

[Series 3: DWI · axial · 3.0mm · 1.80mm/px · z∈[-43,+103]mm · 7 of 98 slices shown (1 of 4)]
[im 1/98]
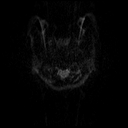
[im 17/98]
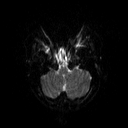
[im 33/98]
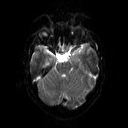
[im 49/98]
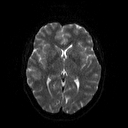
[im 65/98]
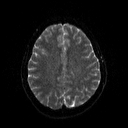
[im 81/98]
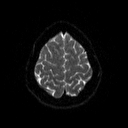
[im 98/98]
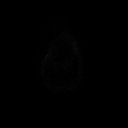

[Series 4: DWI · axial · 3.0mm · 1.80mm/px · z∈[-43,+103]mm · 3 of 50 slices shown (2 of 4)]
[im 1/50]
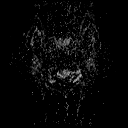
[im 25/50]
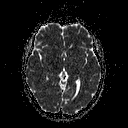
[im 50/50]
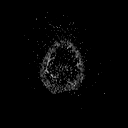

[Series 5: DWI · coronal · 5.0mm · 1.80mm/px · 4 of 63 slices shown (3 of 4)]
[im 1/63]
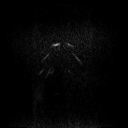
[im 21/63]
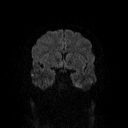
[im 42/63]
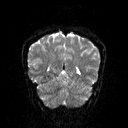
[im 63/63]
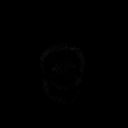

[Series 6: DWI · coronal · 5.0mm · 1.80mm/px · 2 of 34 slices shown (4 of 4)]
[im 1/34]
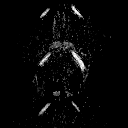
[im 34/34]
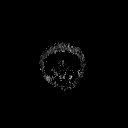

[Series 7: T2 · axial · 5.0mm · 0.60mm/px · 1 of 22 slices shown (1 of 2)]
[im 1/22]
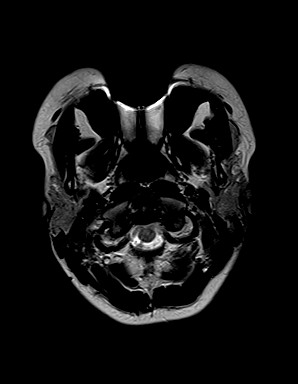

[Series 8: FLAIR · axial · 3.0mm · 0.45mm/px · z∈[-35,+99]mm · 2 of 30 slices shown (1 of 2)]
[im 1/30]
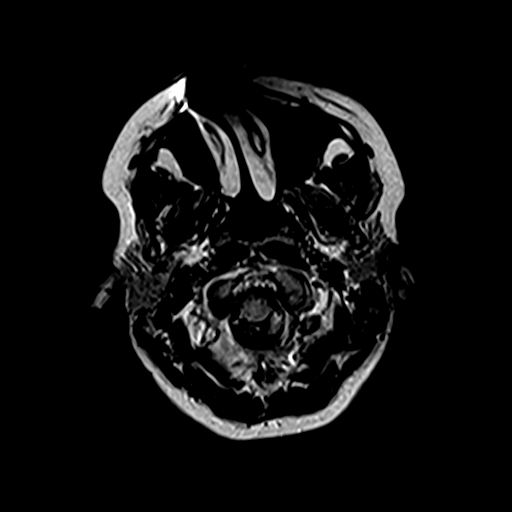
[im 30/30]
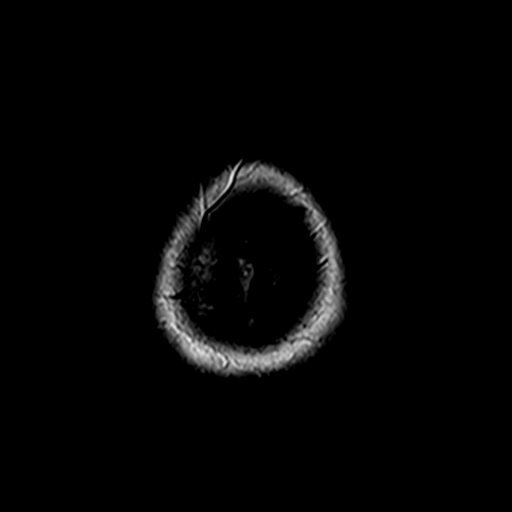

[Series 10: swi_images · axial · 4.0mm · 0.90mm/px · z∈[-40,+99]mm · 2 of 36 slices shown]
[im 1/36]
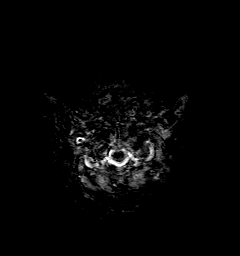
[im 36/36]
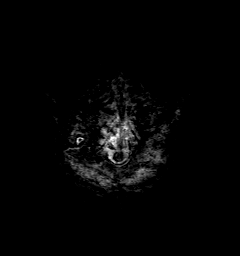

[Series 11: FLAIR · sagittal · 5.0mm · 0.45mm/px · 2 of 25 slices shown (2 of 2)]
[im 1/25]
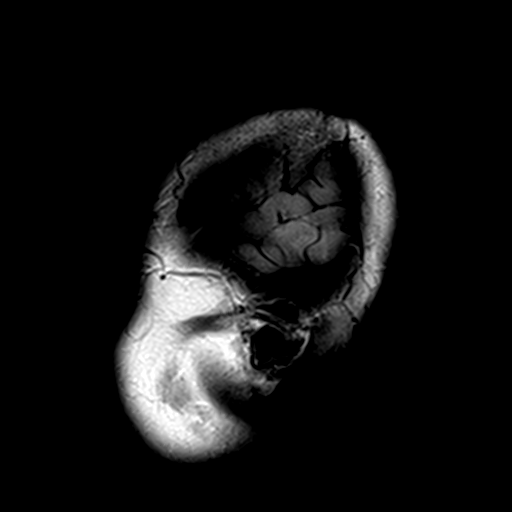
[im 25/25]
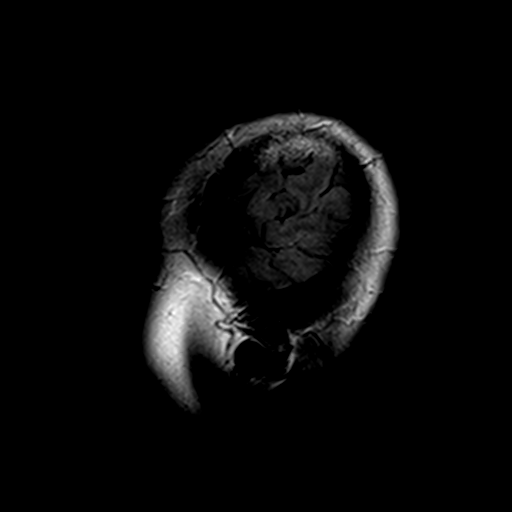

[Series 12: t1_mpr_tra · axial · 1.0mm · 0.75mm/px · z∈[-48,+95]mm · 10 of 144 slices shown]
[im 1/144]
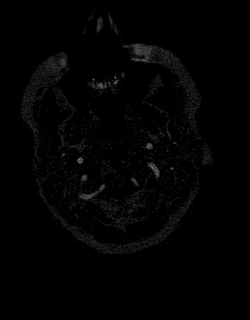
[im 16/144]
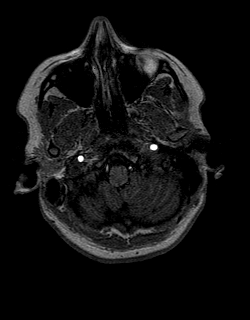
[im 32/144]
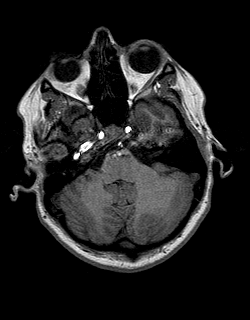
[im 48/144]
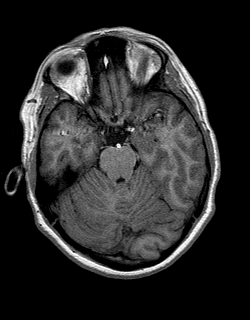
[im 64/144]
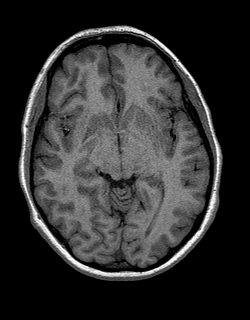
[im 80/144]
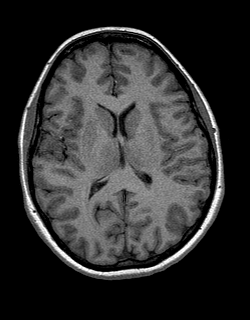
[im 96/144]
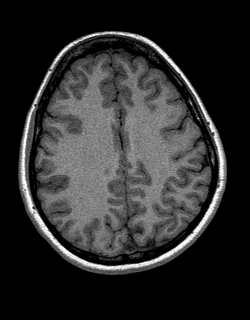
[im 112/144]
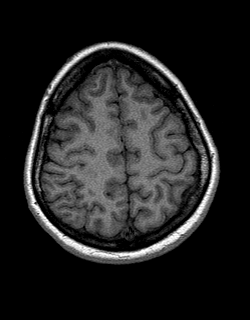
[im 128/144]
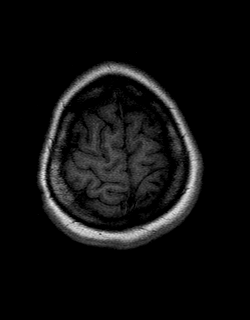
[im 144/144]
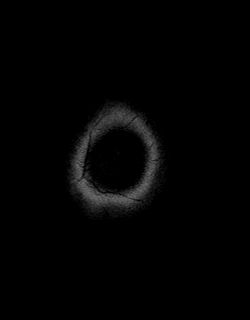

[Series 13: T2 · coronal · 5.0mm · 0.45mm/px · 2 of 25 slices shown (2 of 2)]
[im 1/25]
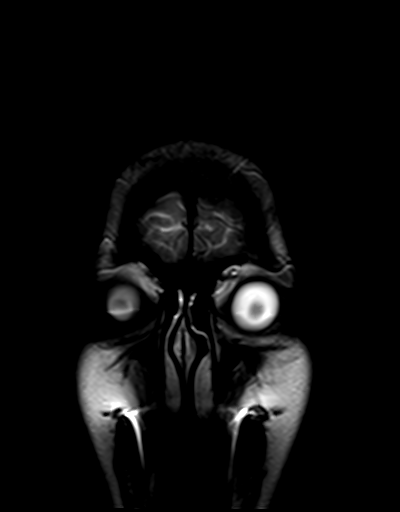
[im 25/25]
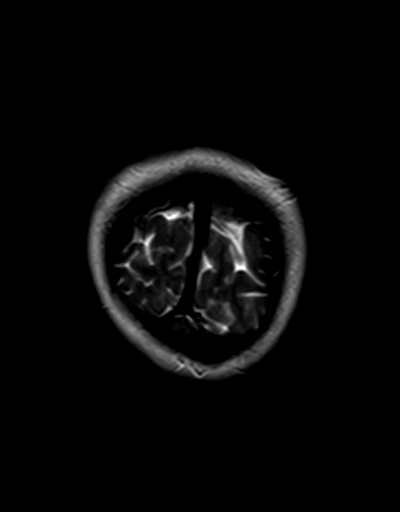

[Series 14: t1_mpr_tra post · axial · 1.0mm · 0.75mm/px · z∈[-48,+95]mm · 10 of 144 slices shown]
[im 1/144]
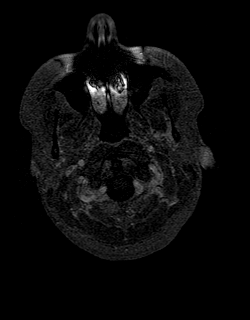
[im 16/144]
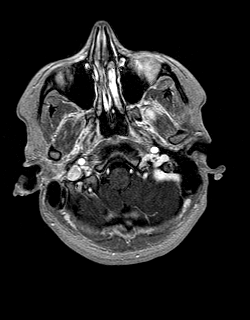
[im 32/144]
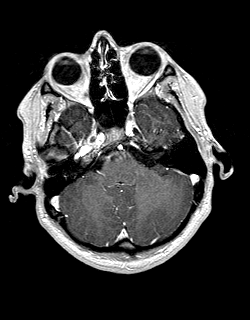
[im 48/144]
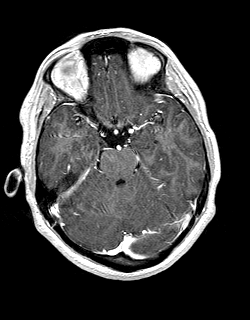
[im 64/144]
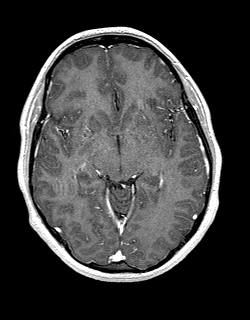
[im 80/144]
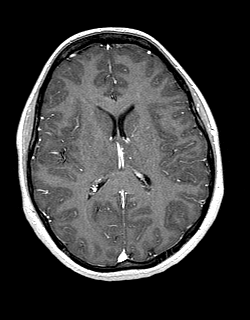
[im 96/144]
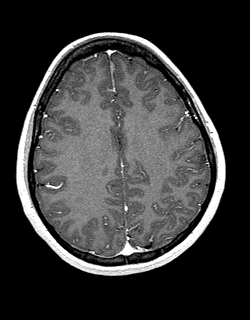
[im 112/144]
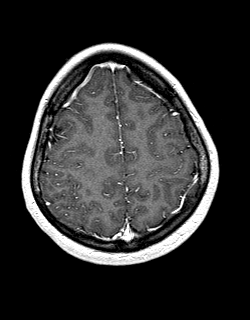
[im 128/144]
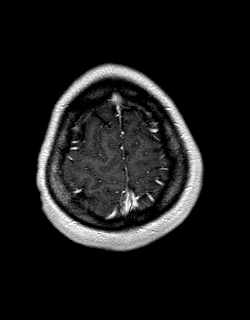
[im 144/144]
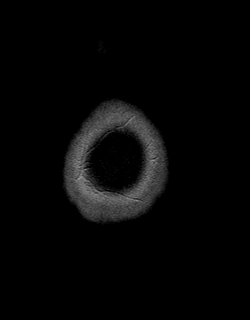

[Series 15: post cor · coronal · 5.0mm · 0.45mm/px · 2 of 25 slices shown]
[im 1/25]
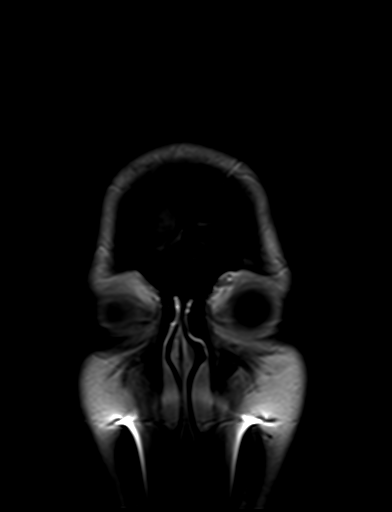
[im 25/25]
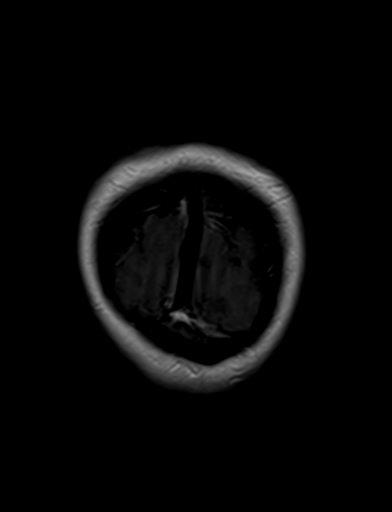

[48 of 48 positions shown; findings below may reference images not displayed]

FINDINGS: MRI HEAD FINDINGS

Brain: Cerebral volume within normal limits for patient age. No
focal parenchymal signal abnormality identified.

No abnormal foci of restricted diffusion to suggest acute or
subacute ischemia. Gray-white matter differentiation well
maintained. No encephalomalacia to suggest chronic infarction. No
foci of susceptibility artifact to suggest acute or chronic
intracranial hemorrhage.

No mass lesion, midline shift or mass effect. No hydrocephalus. No
extra-axial fluid collection.

Pituitary gland and suprasellar region are normal. Midline
structures intact and normal.

No abnormal enhancement.

Vascular: Major intracranial vascular flow voids well maintained and
normal in appearance.

Skull and upper cervical spine: Craniocervical junction normal.
Visualized upper cervical spine within normal limits. Bone marrow
signal intensity normal. No scalp soft tissue abnormality.

Sinuses/Orbits: Globes and orbital soft tissues within normal
limits.

Paranasal sinuses are clear. No mastoid effusion. Inner ear
structures normal.

Other: None.

MRI CERVICAL SPINE FINDINGS

Alignment: Straightening of the normal cervical lordosis. No
listhesis.

Vertebrae: Vertebral body height maintained without acute or chronic
fracture. Bone marrow signal intensity within normal limits. No
discrete or worrisome osseous lesions. No abnormal marrow edema or
enhancement.

Cord: Normal signal and morphology.  No abnormal enhancement.

Posterior Fossa, vertebral arteries, paraspinal tissues:
Craniocervical junction within normal limits. Paraspinous and
prevertebral soft tissues are normal. Normal flow voids seen within
the vertebral arteries bilaterally. 1 cm nodule seen within the left
thyroid lobe.

Disc levels: No significant disc pathology seen within the cervical
spine. No disc bulge or focal disc herniation. No significant facet
disease. No canal or neural foraminal stenosis or evidence for
neural impingement.
IMPRESSION: 1. Normal MRI of the brain and cervical spine. No findings to
explain patient's symptoms identified.
2. 1 cm left thyroid nodule, indeterminate. Further evaluation with
dedicated thyroid ultrasound recommended for further evaluation.
(ref: [HOSPITAL]. [DATE]): 143-50).

## 2020-05-18 IMAGING — MR MR CERVICAL SPINE WO/W CM
5 of 8 series · 25 of 48 positions shown · IV contrast (15 ML multihance)
Comparison: Previous studies from [DATE] and [DATE]

CLINICAL DATA: Initial evaluation for bladder dysfunction, burning
and weakness in both legs, arm pain and weakness. Headaches, neck
pain and weakness. Difficulty swallowing, syncope, vertigo.

EXAM:
MRI HEAD WITHOUT AND WITH CONTRAST
MRI CERVICAL SPINE WITHOUT AND WITH CONTRAST
TECHNIQUE: Multiplanar, multiecho pulse sequences of the brain and surrounding
structures, and cervical spine, to include the craniocervical
junction and cervicothoracic junction, were obtained without and
with intravenous contrast.
CONTRAST:  15mL MULTIHANCE GADOBENATE DIMEGLUMINE 529 MG/ML IV SOLN

[Series 3: T1 · sagittal · 3.0mm · 0.41mm/px · 3 of 10 slices shown (1 of 2)]
[im 1/10]
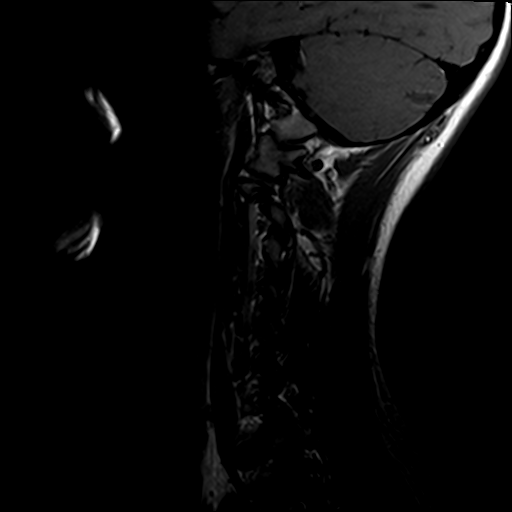
[im 5/10]
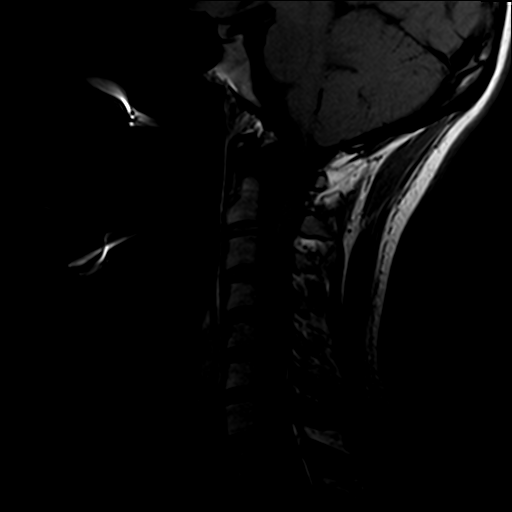
[im 10/10]
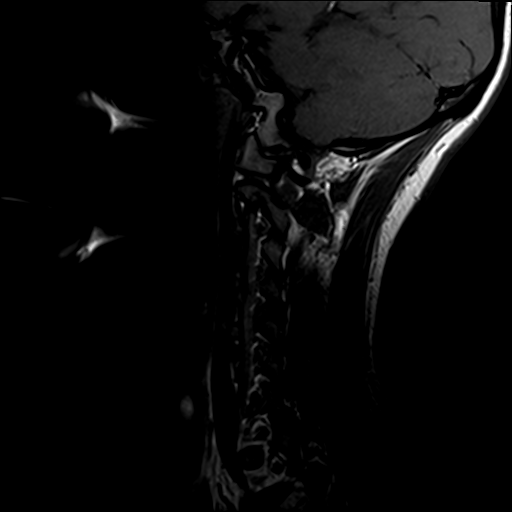

[Series 5: T2 · axial · 3.0mm · 0.70mm/px · z∈[-203,-98]mm · 8 of 29 slices shown (1 of 2)]
[im 1/29]
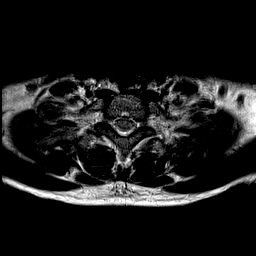
[im 5/29]
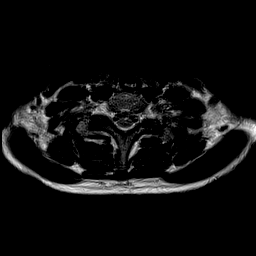
[im 9/29]
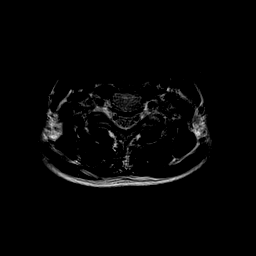
[im 13/29]
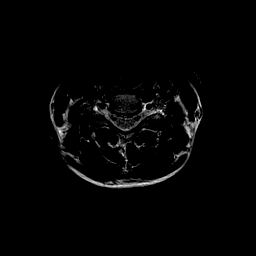
[im 17/29]
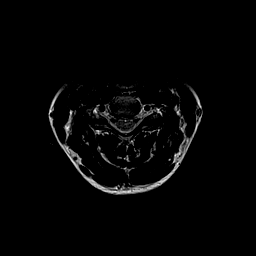
[im 21/29]
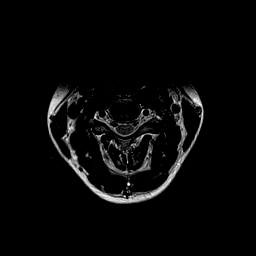
[im 25/29]
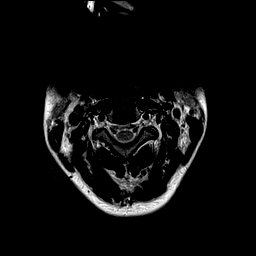
[im 29/29]
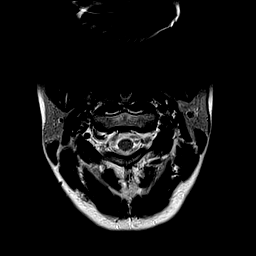

[Series 6: T1 · axial · 3.0mm · 0.35mm/px · z∈[-203,-98]mm · 8 of 29 slices shown (2 of 2)]
[im 1/29]
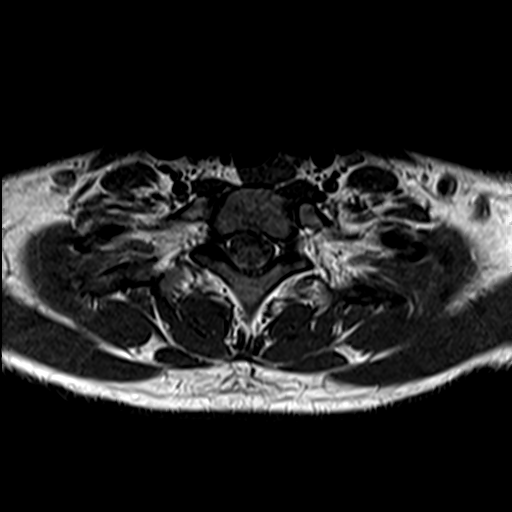
[im 5/29]
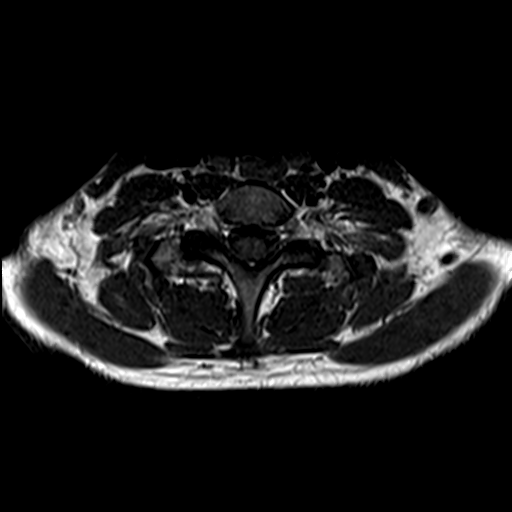
[im 9/29]
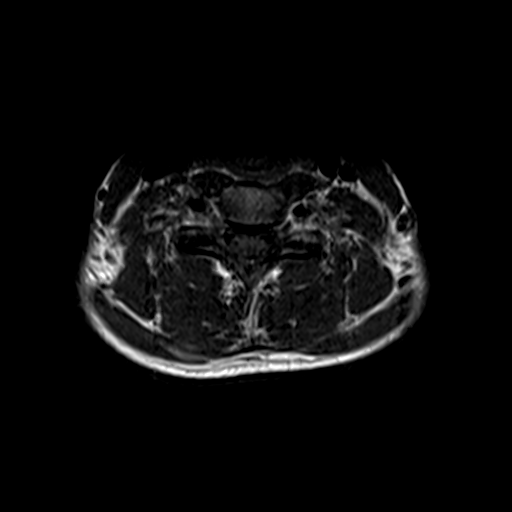
[im 13/29]
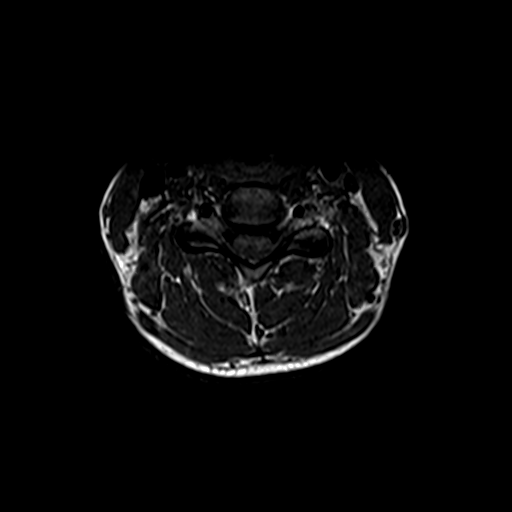
[im 17/29]
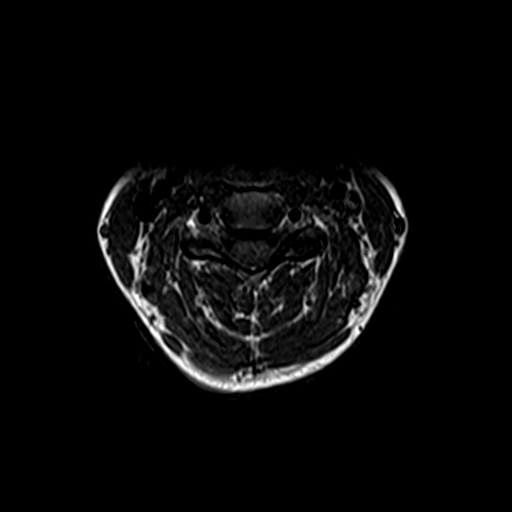
[im 21/29]
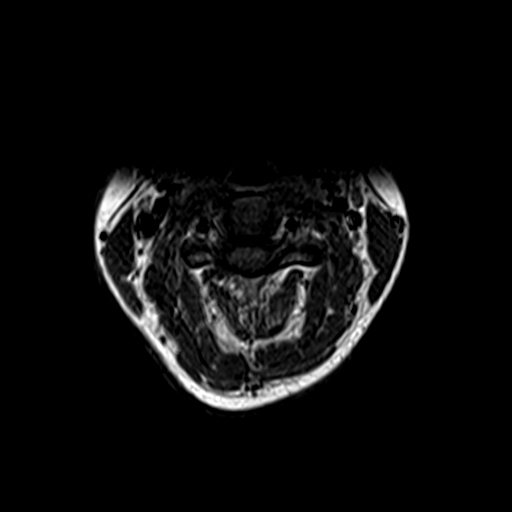
[im 25/29]
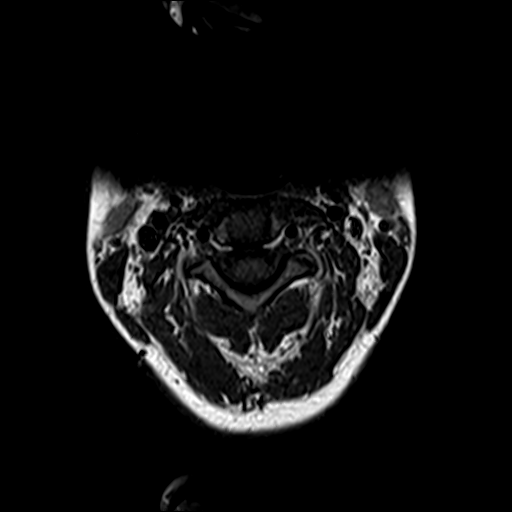
[im 29/29]
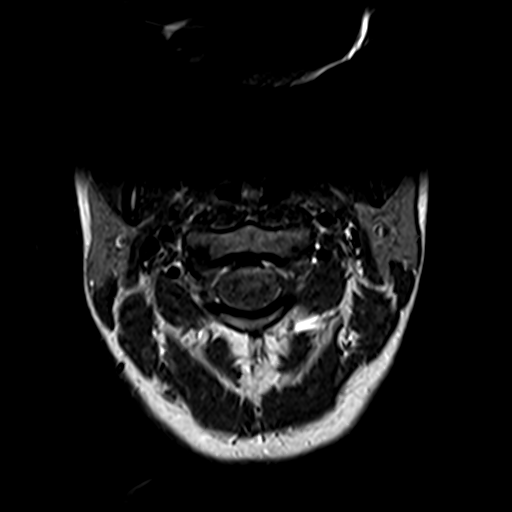

[Series 7: T2 · sagittal · 3.0mm · 0.41mm/px · 4 of 13 slices shown (2 of 2)]
[im 1/13]
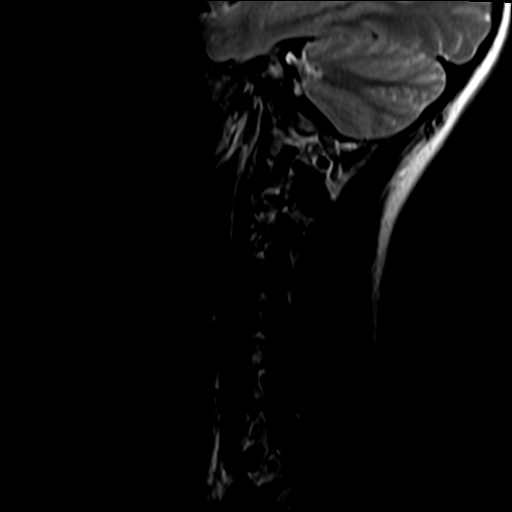
[im 5/13]
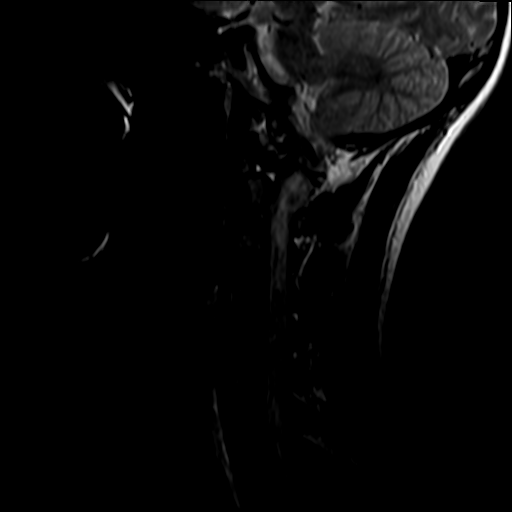
[im 9/13]
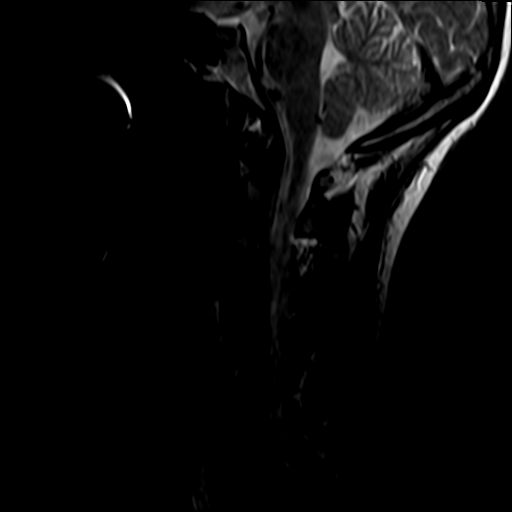
[im 13/13]
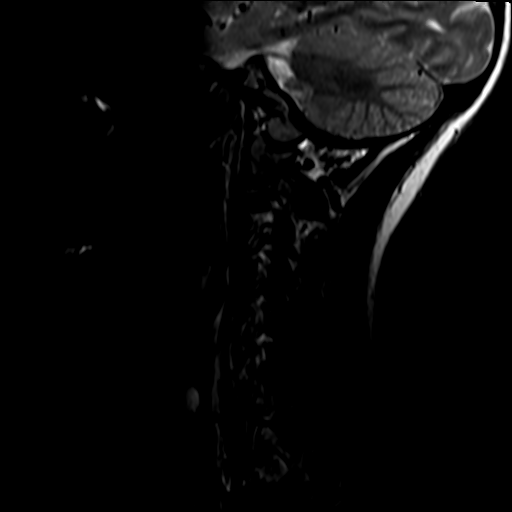

[Series 8: T1 fat-sat post-contrast · sagittal · 3.0mm · 0.82mm/px · 2 of 13 slices shown]
[im 1/13]
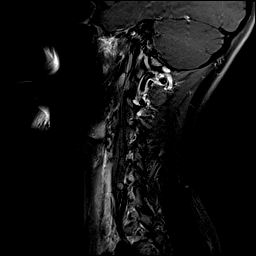
[im 5/13]
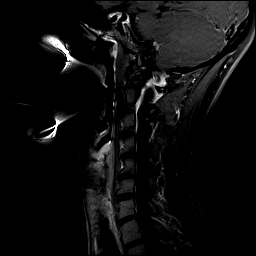

[25 of 48 positions shown; findings below may reference images not displayed]

FINDINGS: MRI HEAD FINDINGS

Brain: Cerebral volume within normal limits for patient age. No
focal parenchymal signal abnormality identified.

No abnormal foci of restricted diffusion to suggest acute or
subacute ischemia. Gray-white matter differentiation well
maintained. No encephalomalacia to suggest chronic infarction. No
foci of susceptibility artifact to suggest acute or chronic
intracranial hemorrhage.

No mass lesion, midline shift or mass effect. No hydrocephalus. No
extra-axial fluid collection.

Pituitary gland and suprasellar region are normal. Midline
structures intact and normal.

No abnormal enhancement.

Vascular: Major intracranial vascular flow voids well maintained and
normal in appearance.

Skull and upper cervical spine: Craniocervical junction normal.
Visualized upper cervical spine within normal limits. Bone marrow
signal intensity normal. No scalp soft tissue abnormality.

Sinuses/Orbits: Globes and orbital soft tissues within normal
limits.

Paranasal sinuses are clear. No mastoid effusion. Inner ear
structures normal.

Other: None.

MRI CERVICAL SPINE FINDINGS

Alignment: Straightening of the normal cervical lordosis. No
listhesis.

Vertebrae: Vertebral body height maintained without acute or chronic
fracture. Bone marrow signal intensity within normal limits. No
discrete or worrisome osseous lesions. No abnormal marrow edema or
enhancement.

Cord: Normal signal and morphology.  No abnormal enhancement.

Posterior Fossa, vertebral arteries, paraspinal tissues:
Craniocervical junction within normal limits. Paraspinous and
prevertebral soft tissues are normal. Normal flow voids seen within
the vertebral arteries bilaterally. 1 cm nodule seen within the left
thyroid lobe.

Disc levels: No significant disc pathology seen within the cervical
spine. No disc bulge or focal disc herniation. No significant facet
disease. No canal or neural foraminal stenosis or evidence for
neural impingement.
IMPRESSION: 1. Normal MRI of the brain and cervical spine. No findings to
explain patient's symptoms identified.
2. 1 cm left thyroid nodule, indeterminate. Further evaluation with
dedicated thyroid ultrasound recommended for further evaluation.
(ref: [HOSPITAL]. [DATE]): 143-50).

## 2020-05-18 MED ORDER — GADOBENATE DIMEGLUMINE 529 MG/ML IV SOLN
15.0000 mL | Freq: Once | INTRAVENOUS | Status: AC | PRN
  Administered 2020-05-18: 15 mL via INTRAVENOUS

## 2020-06-08 ENCOUNTER — Ambulatory Visit: Payer: BC Managed Care – PPO | Admitting: Cardiology

## 2020-09-04 DIAGNOSIS — Z0271 Encounter for disability determination: Secondary | ICD-10-CM

## 2021-01-31 ENCOUNTER — Telehealth: Payer: Self-pay

## 2021-01-31 NOTE — Telephone Encounter (Signed)
° °  Pre-operative Risk Assessment    Patient Name: Cheryl Andrews  DOB: [birth year] MRN: [id number]      Request for Surgical Clearance    Procedure:   LEFT OPEN ANTERIOR SHOULDER RECONSTRUCTION;OPEN BICEPS TENODESIS  Date of Surgery:  Clearance TBD                                 Surgeon:   Surgeon's Group or Practice Name[facility]  [facility] Phone number:  [phone] Fax number:  [phone]   Type of Clearance Requested:   - Medical    Type of Anesthesia:  Not Indicated   Additional requests/questions:

## 2021-02-01 NOTE — Telephone Encounter (Signed)
° °  Name: Cheryl Andrews  DOB: 1995-04-01  MRN: [id number]  Primary Cardiologist: Thomasene Ripple, DO  Chart reviewed as part of pre-operative protocol coverage. Because of [name] past medical history and time since last visit, she will require a follow-up visit in order to better assess preoperative cardiovascular risk.  Pre-op covering staff: - Please schedule appointment and call patient to inform them. If patient already had an upcoming appointment within acceptable timeframe, please add "pre-op clearance" to the appointment notes so provider is aware. - Please contact requesting surgeon's office via preferred method (i.e, phone, fax) to inform them of need for appointment prior to surgery.  If applicable, this message will also be routed to pharmacy pool and/or primary cardiologist for input on holding anticoagulant/antiplatelet agent as requested below so that this information is available to the clearing provider at time of patient's appointment.   [place], [place]  02/01/2021, 9:26 AM

## 2021-02-01 NOTE — Telephone Encounter (Signed)
I s/w the pt in regard to needing an appt for pre op clearance. Pt is agreeable to plan of care. Pt has been scheduled to see Edd Fabian, FNP 02/22/21 @ 1:55. Pt was offered a sooner appt though pt opted to keep the 02/22/21 appt. I will forward notes to FNP for appt. Will send FYI to requesting office pt has appt 02/22/21.

## 2021-02-21 NOTE — Progress Notes (Signed)
Cardiology Clinic Note   Patient Name: Cheryl Andrews Date of Encounter: 02/21/2021  Primary Care Provider:  Adrienne Mocha, PA (Inactive) Primary Cardiologist:  Thomasene Ripple, DO  Patient Profile    Cheryl Andrews presents to the clinic today for follow-up evaluation of her atypical chest discomfort and palpitations.  Past Medical History    Past Medical History:  Diagnosis Date   ADHD (attention deficit hyperactivity disorder), combined type    Anxiety    Asthma    Attention deficit hyperactivity disorder 12/26/2014   Atypical chest pain 08/16/2019   Chronic cluster headache 06/25/2013   Chronic diarrhea    Chronic migraine 07/20/2019   Interstitial cystitis    MDD (major depressive disorder), recurrent episode, moderate (HCC) 09/29/2018   Migraine without aura and without status migrainosus, not intractable 07/20/2019   Palpitations 08/16/2019   Syncope and collapse 08/16/2019   Trigeminal autonomic cephalgias    Past Surgical History:  Procedure Laterality Date   TONSILLECTOMY  2002   WISDOM TOOTH EXTRACTION  2016    Allergies  Allergies  Allergen Reactions   Paba Derivatives Hives   Protonix [Pantoprazole] Nausea And Vomiting    History of Present Illness    Aisia Mccarty has a PMH of chronic migraine, asthma, chronic diarrhea, interstitial cystitis, major depressive disorder, palpitations, syncope and collapse, atypical chest pain, anxiety and ADHD.  Echocardiogram 08/18/2019 showed normal LVEF.  She was previously seen and evaluated by neurology and was seen by migraine specialist in [facility] Dr. Allena Katz.  She noted ringing in her ears which made her feel weak.  She noted passing out spells.  It was recommended by Dr. Servando Salina that she see an ear nose and throat specialist.  It was also encouraged that she continue to work with neurology in [place].  She presents to the clinic today for follow-up evaluation and preoperative cardiac evaluation.  She  states she recently had spine surgery which she is recovering from.  She is wearing back brace.  She reports that she is off of her beta-blocker medication.  She occasionally has accelerated heartbeats or extra beats related to standing.  She continues to maintain her p.o. hydration and sodium intake.  We reviewed her upcoming shoulder surgery.  We will plan follow-up for around 9 months.  I have asked her to contact us with symptoms related to her heart.  She expressed understanding.  Today she denies chest pain, shortness of breath, lower extremity edema, fatigue, palpitations, melena, hematuria, hemoptysis, diaphoresis, weakness, presyncope, syncope, orthopnea, and PND.   Home Medications    Prior to Admission medications   Medication Sig Start Date End Date Taking? Authorizing Provider  albuterol (VENTOLIN HFA) 108 (90 Base) MCG/ACT inhaler Inhale 1 puff into the lungs every 6 (six) hours as needed for wheezing or shortness of breath.    [provider]  amphetamine-dextroamphetamine (ADDERALL) 5 MG tablet Take 5 mg by mouth daily.    [provider]  chlorzoxazone (PARAFON) 500 MG tablet Take 500 mg by mouth 3 (three) times daily as needed for muscle spasms.    [provider]  DULoxetine (CYMBALTA) 20 MG capsule Take 20 mg by mouth 2 (two) times daily.    [provider]  levonorgestrel (KYLEENA) 19.5 MG IUD by Intrauterine route once.    [provider]  Meth-Hyo-M Bl-Na Phos-Ph Sal (URIBEL) 118 MG CAPS Take 1 capsule by mouth in the morning, at noon, in the evening, and at bedtime.  12/21/18  [provider]  methocarbamol (ROBAXIN) 750 MG tablet Take 750 mg by mouth at bedtime. 10/26/19   [provider]  midodrine (PROAMATINE) 2.5 MG tablet Take 1 tablet (2.5 mg total) by mouth 3 (three) times daily with meals. 04/12/20   Tobb, Kardie, DO  naproxen sodium (ALEVE) 220 MG tablet Take 220 mg by mouth as needed.    [provider]  ondansetron (ZOFRAN) 4 MG tablet Take 4 mg by mouth daily.    [provider]  ondansetron (ZOFRAN-ODT) 4 MG disintegrating tablet Take 4 mg by mouth. 10/14/19   [provider]  promethazine (PHENERGAN) 12.5 MG tablet TAKE 1 TABLET BY MOUTH EVERY 6 HOURS AS NEEDED FOR NAUSEA AND VOMITING 01/03/20   Penumalli, Earlean Polka, MD  propranolol (INDERAL) 20 MG tablet Take 1 tablet (20 mg total) by mouth every 8 (eight) hours. 04/12/20   Berniece Salines, DO    Family History    Family History  Problem Relation Age of Onset   Arrhythmia Mother    Thyroid disease Mother    Hypertension Mother    Hypertension Father    Diabetes Maternal Grandfather    Hypertension Maternal Grandfather    Uterine cancer Paternal Grandmother    Diabetes Paternal Grandfather    Hypertension Paternal Grandfather    She indicated that her mother is alive. She indicated that her father is alive. She indicated that all of her three brothers are alive. She indicated that her maternal grandmother is alive. She indicated that her maternal grandfather is alive. She indicated that her paternal grandmother is deceased. She indicated that her paternal grandfather is alive.  Social History    Social History   Socioeconomic History   Marital status: Married    Spouse name: Not on file   Number of children: 0   Years of education: Not on file   Highest education level: High school graduate  Occupational History   Not on file  Tobacco Use   Smoking status: Former    Types: Cigarettes    Quit date: 01/07/2017    Years since quitting: 4.1   Smokeless tobacco: Never  Substance and Sexual Activity   Alcohol use: Never   Drug use: Never   Sexual activity: Not on file  Other Topics Concern   Not on file  Social History Narrative   Lives with husband   Caffeine- coffee 1 c daily   Social Determinants of Health   Financial Resource Strain: Not on file  Food Insecurity: Not on file   Transportation Needs: Not on file  Physical Activity: Not on file  Stress: Not on file  Social Connections: Not on file  Intimate Partner Violence: Not on file     Review of Systems    General:  No chills, fever, night sweats or weight changes.  Cardiovascular:  No chest pain, dyspnea on exertion, edema, orthopnea, palpitations, paroxysmal nocturnal dyspnea. Dermatological: No rash, lesions/masses Respiratory: No cough, dyspnea Urologic: No hematuria, dysuria Abdominal:   No nausea, vomiting, diarrhea, bright red blood per rectum, melena, or hematemesis Neurologic:  No visual changes, wkns, changes in mental status. All other systems reviewed and are otherwise negative except as noted above.  Physical Exam    VS:  There were no vitals taken for this visit. , BMI There is no height or weight on file to calculate BMI. GEN: Well nourished, well developed, in no acute distress. HEENT: normal. Neck: Supple, no JVD, carotid bruits, or masses. Cardiac: RRR,  no murmurs, rubs, or gallops. No clubbing, cyanosis, edema.  Radials/DP/PT 2+ and equal bilaterally.  Respiratory:  Respirations regular and unlabored, clear to auscultation bilaterally. GI: Soft, nontender, nondistended, BS + x 4. MS: no deformity or atrophy. Skin: warm and dry, no rash. Neuro:  Strength and sensation are intact. Psych: Normal affect.  Accessory Clinical Findings    Recent Labs: No results found for requested labs within last 8760 hours.   Recent Lipid Panel No results found for: CHOL, TRIG, HDL, CHOLHDL, VLDL, LDLCALC, LDLDIRECT  ECG personally reviewed by me today-normal sinus rhythm no ST or T wave deviation 85 bpm- No acute changes  Echocardiogram 08/18/2019 IMPRESSIONS     1. Left ventricular ejection fraction, by estimation, is 60 to 65%. The  left ventricle has normal function. The left ventricle has no regional  wall motion abnormalities. Left ventricular diastolic parameters were  normal.    FINDINGS   Left Ventricle: Left ventricular ejection fraction, by estimation, is 60  to 65%. The left ventricle has normal function. The left ventricle has no  regional wall motion abnormalities. The left ventricular internal cavity  size was normal in size. There is   no left ventricular hypertrophy. Left ventricular diastolic parameters  were normal.   Right Ventricle: The right ventricular size is normal. No increase in  right ventricular wall thickness. Right ventricular systolic function is  normal. There is normal pulmonary artery systolic pressure. The tricuspid  regurgitant velocity is 2.47 m/s, and   with an assumed right atrial pressure of 3 mmHg, the estimated right  ventricular systolic pressure is [id number] mmHg.   Left Atrium: Left atrial size was normal in size.   Right Atrium: Right atrial size was normal in size.   Pericardium: There is no evidence of pericardial effusion.   Mitral Valve: The mitral valve is normal in structure. Normal mobility of  the mitral valve leaflets. No evidence of mitral valve regurgitation. No  evidence of mitral valve stenosis.   Tricuspid Valve: The tricuspid valve is normal in structure. Tricuspid  valve regurgitation is not demonstrated. No evidence of tricuspid  stenosis.   Aortic Valve: The aortic valve is normal in structure. Aortic valve  regurgitation is not visualized. No aortic stenosis is present.   Pulmonic Valve: The pulmonic valve was normal in structure. Pulmonic valve  regurgitation is not visualized. No evidence of pulmonic stenosis.   Aorta: The aortic root is normal in size and structure.   Venous: The inferior vena cava is normal in size with greater than 50%  respiratory variability, suggesting right atrial pressure of 3 mmHg.  Palpitations 09/22/2019 The patient wore the monitor for 11 days 11 hours starting 08/16/2019. Indication: Palpitations   The minimum heart rate was 44 bpm, maximum heart rate was171 bpm,  and average heart rate was 75  bpm. Predominant underlying rhythm was Sinus Rhythm.   Premature atrial complexes were rare (<1.0%). Premature Ventricular complexes were rare (<1.0%).   No ventricular tachycardia, No pauses, No AV block, no supraventricular tachycardia and no atrial fibrillation present.   23  patient triggered events: 3 associated with premature ventricular complex, 5 associated with premature atrial complex and the remaining is associated with sinus rhythm and sinus tachycardia. 14 diary events all associate with sinus rhythm and sinus tachycardia.   Conclusion: Unremarkable study.  Assessment & Plan   1.   Palpitations-continues to experience intermittent periods of brief palpitations.  Cardiac event monitor 09/22/2019 showed minimum heart rate of 44 bpm,  maximum heart rate 171 bpm and average heart rate of 75 bpm.  She was predominantly normal sinus rhythm.  No evidence of pauses, AV block, SVT, or atrial fibrillation.  23 patient triggered events.  3 events associated with PVCs, 5 associated with PACs and remaining associated events sinus rhythm or sinus tachycardia. No plans for further evaluation at this time. Maintain p.o. hydration   Syncope and collapse-no recent episodes of presyncope or syncope.  Cardiac event monitor unremarkable.  Echocardiogram showed normal LVEF.  Continues to follow with neurology Lower extremity support stockings Liberalize sodium intake  Preoperative cardiac evaluation-left open shoulder reconstruction with biceps tendesis, [facility], fax [phone]  Primary Cardiologist: Berniece Salines, DO  Chart reviewed as part of pre-operative protocol coverage. Given past medical history and time since last visit, based on ACC/AHA guidelines, Janye Vandeven would be at acceptable risk for the planned procedure without further cardiovascular testing.   Her RCRI is class I risk, 0.4% risk of major cardiac event.  She is  able to complete greater than 4 METS of physical activity.  Patient was advised that if she develops new symptoms prior to surgery to contact our office to arrange a follow-up appointment.  He verbalized understanding.  I will route this recommendation to the requesting party via Epic fax function and remove from pre-op pool.  Please call with questions.   Disposition: Follow-up with Dr. Harriet Masson in 9 months.   Jossie Ng. Amarylis Rovito NP-C    02/21/2021, 7:46 AM [address] Office [phone] Fax [phone]  Notice: This dictation was prepared with Dragon dictation along with smaller phrase technology. Any transcriptional errors that result from this process are unintentional and may not be corrected upon review.  I spent 13 minutes examining this patient, reviewing medications, and using patient centered shared decision making involving her cardiac care.  Prior to her visit I spent greater than 20 minutes reviewing her past medical history,  medications, and prior cardiac tests.

## 2021-02-22 ENCOUNTER — Ambulatory Visit (INDEPENDENT_AMBULATORY_CARE_PROVIDER_SITE_OTHER): Payer: BC Managed Care – PPO | Admitting: General Practice

## 2021-02-22 ENCOUNTER — Encounter: Payer: Self-pay | Admitting: General Practice

## 2021-02-22 ENCOUNTER — Other Ambulatory Visit: Payer: Self-pay

## 2021-02-22 VITALS — BP 118/64 | HR 85 | Ht 72.0 in | Wt 184.2 lb

## 2021-02-22 DIAGNOSIS — R002 Palpitations: Secondary | ICD-10-CM | POA: Diagnosis not present

## 2021-02-22 DIAGNOSIS — Z01810 Encounter for preprocedural cardiovascular examination: Secondary | ICD-10-CM | POA: Diagnosis not present

## 2021-02-22 DIAGNOSIS — R55 Syncope and collapse: Secondary | ICD-10-CM

## 2021-02-22 NOTE — Patient Instructions (Signed)
Medication Instructions:  The current medical regimen is effective;  continue present plan and medications as directed. Please refer to the Current Medication list given to you today.   *If you need a refill on your cardiac medications before your next appointment, please call your pharmacy*  Lab Work:   Testing/Procedures:  NONE    NONE  Special Instructions MAINTAIN HYDRATION AND SODIUM INTAKE  CLEARED FOR RIGHT SHOULDER SURGERY  Follow-Up: Your next appointment:  9 month(s) In Person with Thomasene Ripple, DO    Please call our office 2 months in advance to schedule this appointment  :1  At [facility], you and your health needs are our priority.  As part of our continuing mission to provide you with exceptional heart care, we have created designated Provider Care Teams.  These Care Teams include your primary Cardiologist (physician) and Advanced Practice Providers (APPs -  Physician Assistants and Nurse Practitioners) who all work together to provide you with the care you need, when you need it.  We recommend signing up for the patient portal called "MyChart".  Sign up information is provided on this After Visit Summary.  MyChart is used to connect with patients for Virtual Visits (Telemedicine).  Patients are able to view lab/test results, encounter notes, upcoming appointments, etc.  Non-urgent messages can be sent to your provider as well.   To learn more about what you can do with MyChart, go to [email or web address].
